# Patient Record
Sex: Male | Born: 1985 | Race: White | Hispanic: No | Marital: Single | Smoking: Former smoker
Health system: Southern US, Community
[De-identification: ages and names within clinical notes are randomized; demographics above are authoritative.]

## PROBLEM LIST (undated history)

## (undated) DIAGNOSIS — F1110 Opioid abuse, uncomplicated: Secondary | ICD-10-CM

---
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
## 2005-01-12 ENCOUNTER — Emergency Department (HOSPITAL_COMMUNITY): Admission: EM | Admit: 2005-01-12 | Discharge: 2005-01-12 | Payer: Self-pay | Admitting: Emergency Medicine

## 2005-01-17 ENCOUNTER — Inpatient Hospital Stay (HOSPITAL_COMMUNITY): Admission: EM | Admit: 2005-01-17 | Discharge: 2005-01-19 | Payer: Self-pay | Admitting: Emergency Medicine

## 2005-01-22 ENCOUNTER — Emergency Department (HOSPITAL_COMMUNITY): Admission: EM | Admit: 2005-01-22 | Discharge: 2005-01-22 | Payer: Self-pay | Admitting: Emergency Medicine

## 2005-04-17 ENCOUNTER — Emergency Department (HOSPITAL_COMMUNITY): Admission: EM | Admit: 2005-04-17 | Discharge: 2005-04-17 | Payer: Self-pay | Admitting: Emergency Medicine

## 2006-01-18 ENCOUNTER — Inpatient Hospital Stay (HOSPITAL_COMMUNITY): Admission: EM | Admit: 2006-01-18 | Discharge: 2006-01-20 | Payer: Self-pay | Admitting: Emergency Medicine

## 2006-07-25 ENCOUNTER — Emergency Department (HOSPITAL_COMMUNITY): Admission: EM | Admit: 2006-07-25 | Discharge: 2006-07-25 | Payer: Self-pay | Admitting: Emergency Medicine

## 2010-09-22 ENCOUNTER — Emergency Department (HOSPITAL_COMMUNITY): Payer: No Typology Code available for payment source

## 2010-09-22 ENCOUNTER — Emergency Department (HOSPITAL_COMMUNITY)
Admission: EM | Admit: 2010-09-22 | Discharge: 2010-09-23 | Disposition: A | Discharge status: Home / Self Care | Payer: No Typology Code available for payment source | Attending: Emergency Medicine | Admitting: Emergency Medicine

## 2010-09-22 DIAGNOSIS — M545 Low back pain, unspecified: Secondary | ICD-10-CM | POA: Insufficient documentation

## 2010-09-22 DIAGNOSIS — S0993XA Unspecified injury of face, initial encounter: Secondary | ICD-10-CM | POA: Insufficient documentation

## 2010-09-22 DIAGNOSIS — M542 Cervicalgia: Secondary | ICD-10-CM | POA: Insufficient documentation

## 2010-12-07 ENCOUNTER — Emergency Department (HOSPITAL_COMMUNITY): Payer: Self-pay

## 2010-12-07 ENCOUNTER — Emergency Department (HOSPITAL_COMMUNITY)
Admission: EM | Admit: 2010-12-07 | Discharge: 2010-12-07 | Disposition: A | Discharge status: Home / Self Care | Payer: Self-pay | Attending: Emergency Medicine | Admitting: Emergency Medicine

## 2010-12-07 DIAGNOSIS — M25473 Effusion, unspecified ankle: Secondary | ICD-10-CM | POA: Insufficient documentation

## 2010-12-07 DIAGNOSIS — M25579 Pain in unspecified ankle and joints of unspecified foot: Secondary | ICD-10-CM | POA: Insufficient documentation

## 2010-12-07 DIAGNOSIS — M25476 Effusion, unspecified foot: Secondary | ICD-10-CM | POA: Insufficient documentation

## 2010-12-07 DIAGNOSIS — F17200 Nicotine dependence, unspecified, uncomplicated: Secondary | ICD-10-CM | POA: Insufficient documentation

## 2010-12-07 DIAGNOSIS — Z9889 Other specified postprocedural states: Secondary | ICD-10-CM | POA: Insufficient documentation

## 2010-12-07 DIAGNOSIS — M79609 Pain in unspecified limb: Secondary | ICD-10-CM | POA: Insufficient documentation

## 2013-08-14 ENCOUNTER — Emergency Department (HOSPITAL_COMMUNITY)
Admission: EM | Admit: 2013-08-14 | Discharge: 2013-08-14 | Disposition: A | Discharge status: Home / Self Care | Payer: Managed Care, Other (non HMO) | Attending: Emergency Medicine | Admitting: Emergency Medicine

## 2013-08-14 ENCOUNTER — Encounter (HOSPITAL_COMMUNITY): Payer: Self-pay | Admitting: Emergency Medicine

## 2013-08-14 DIAGNOSIS — F17200 Nicotine dependence, unspecified, uncomplicated: Secondary | ICD-10-CM | POA: Insufficient documentation

## 2013-08-14 DIAGNOSIS — J020 Streptococcal pharyngitis: Secondary | ICD-10-CM | POA: Insufficient documentation

## 2013-08-14 DIAGNOSIS — Z79899 Other long term (current) drug therapy: Secondary | ICD-10-CM | POA: Insufficient documentation

## 2013-08-14 MED ORDER — PENICILLIN G BENZATHINE 1200000 UNIT/2ML IM SUSP
1.2000 10*6.[IU] | Freq: Once | INTRAMUSCULAR | Status: AC
  Administered 2013-08-14: 1.2 10*6.[IU] via INTRAMUSCULAR
  Filled 2013-08-14: qty 2

## 2013-08-14 MED ORDER — METHYLPREDNISOLONE SODIUM SUCC 125 MG IJ SOLR
125.0000 mg | Freq: Once | INTRAMUSCULAR | Status: AC
  Administered 2013-08-14: 125 mg via INTRAMUSCULAR
  Filled 2013-08-14: qty 2

## 2013-08-14 NOTE — ED Notes (Signed)
Pt c/o sore throat; states tonsil so swollen he is having trouble swallowing food; c/o swelling of nodes right side of neck; muffled voice

## 2013-08-14 NOTE — ED Provider Notes (Signed)
CSN: [id number]     Arrival date & time 08/14/13  1209 History   First MD Initiated Contact with Patient 08/14/13 1225     Chief Complaint  Patient presents with  . Sore Throat   (Consider location/radiation/quality/duration/timing/severity/associated sxs/prior Treatment) Patient is a 28 y.o. male presenting with pharyngitis. The history is provided by the patient and medical records.  Sore Throat Associated symptoms include a sore throat.   This is a 28 year old male with no significant past medical history presented to the ED for sore throat and painful swallowing.  Patient states symptoms started on Saturday and have been progressively worsening. Patient has a history of recurrent strep throat, and was told that if he got this again he would need a tonsillectomy. Patient endorses subjective cervical lymphadenopathy and fever. He denies difficulty swallowing or handling secretions.  Patient requesting water to drink on arrival, VS stable.  History reviewed. No pertinent past medical history. History reviewed. No pertinent past surgical history. No family history on file. History  Substance Use Topics  . Smoking status: Current Every Day Smoker -- 0.50 packs/day    Types: Cigarettes  . Smokeless tobacco: Not on file  . Alcohol Use: Not on file    Review of Systems  HENT: Positive for sore throat.   All other systems reviewed and are negative.   Allergies  Review of patient's allergies indicates no known allergies.  Home Medications   Current Outpatient Rx  Name  Route  Sig  Dispense  Refill  . ibuprofen (ADVIL,MOTRIN) 200 MG tablet   Oral   Take 800 mg by mouth every 6 (six) hours as needed.         . naphazoline-glycerin (CLEAR EYES) 0.012-0.2 % SOLN   Both Eyes   Place 1-2 drops into both eyes every 4 (four) hours as needed for irritation.         Marland Kitchen OVER THE COUNTER MEDICATION   Oral   Take 1 Package by mouth daily. Emergen-C         . Pheniramine-PE-APAP  (THERAFLU FLU & SORE THROAT) 20-10-650 MG PACK   Oral   Take 1 Package by mouth every 6 (six) hours as needed (cough).         . Pseudoeph-Doxylamine-DM-APAP (DAYQUIL/NYQUIL COLD/FLU RELIEF PO)   Oral   Take 2 capsules by mouth 2 (two) times daily as needed. Cough and congestion          BP 133/91  Pulse 90  Temp(Src) 98.3 F (36.8 C)  Resp 20  SpO2 98%  Physical Exam  Nursing note and vitals reviewed. Constitutional: He is oriented to person, place, and time. He appears well-developed and well-nourished.  HENT:  Head: Normocephalic and atraumatic.  Mouth/Throat: Uvula is midline. Posterior oropharyngeal erythema present. No oropharyngeal exudate, posterior oropharyngeal edema or tonsillar abscesses.  Tonsils 2+ bilaterally with exudate on right tonsil; uvula remains midline, no peritonsillar abscess; handling secretions appropriately; no difficulty swallowing; airway patient-- talking non-stop in full complete sentences without difficulty  Eyes: Conjunctivae and EOM are normal. Pupils are equal, round, and reactive to light.  Neck: Trachea normal, normal range of motion and phonation normal. No tracheal tenderness present.  Voice normal  Cardiovascular: Normal rate, regular rhythm and normal heart sounds.   Pulmonary/Chest: Effort normal and breath sounds normal. No stridor.  Abdominal: Soft. Bowel sounds are normal.  Musculoskeletal: Normal range of motion.  Lymphadenopathy:    He has cervical adenopathy.  Neurological: He is alert and oriented to  person, place, and time.  Skin: Skin is warm and dry.  Psychiatric: He has a normal mood and affect.    ED Course  Procedures (including critical care time) Labs Review Labs Reviewed - No data to display Imaging Review No results found.  EKG Interpretation   None       MDM   Final diagnoses:  Strep pharyngitis   Pt with 2+ swollen tonsils that are symmetric w/exudate present on right tonsil, uvula tracking  midline without signs of peritonsillar abscess, handling secretions appropriately, continuously speaking in full complete sentences.  Normal phonation, no stridor or muffled tones.  Pt drinking water in the ED without difficulty. He will be treated for strep pharyngitis with bicillin and solu-medrol and referred to ENT to discuss possible tonsillectomy due to his hx.  Discussed plan with pt, he acknowledged understanding and agreed with plan of care.  Garlon HatchetLisa M Sanders, PA-C 08/14/13 1337

## 2013-08-14 NOTE — ED Provider Notes (Signed)
Medical screening examination/treatment/procedure(s) were performed by non-physician practitioner and as supervising physician I was immediately available for consultation/collaboration.  EKG Interpretation   None         Joel Murphy M Jamie-Lee Galdamez, MD 08/14/13 1556 

## 2013-08-14 NOTE — Discharge Instructions (Signed)
You have been treated for strep throat.  May use over the counter chloraseptic spray or salt water gargles to help with pain. Follow up with ENT if problems occur or if you wish to discuss possible tonsillectomy. Return to the ED for new concerns.

## 2013-08-15 ENCOUNTER — Encounter (HOSPITAL_COMMUNITY): Payer: Self-pay | Admitting: Emergency Medicine

## 2013-08-15 ENCOUNTER — Emergency Department (HOSPITAL_COMMUNITY)
Admission: EM | Admit: 2013-08-15 | Discharge: 2013-08-15 | Disposition: A | Discharge status: Home / Self Care | Payer: Managed Care, Other (non HMO) | Attending: Emergency Medicine | Admitting: Emergency Medicine

## 2013-08-15 DIAGNOSIS — H9209 Otalgia, unspecified ear: Secondary | ICD-10-CM | POA: Insufficient documentation

## 2013-08-15 DIAGNOSIS — F17200 Nicotine dependence, unspecified, uncomplicated: Secondary | ICD-10-CM | POA: Insufficient documentation

## 2013-08-15 DIAGNOSIS — J029 Acute pharyngitis, unspecified: Secondary | ICD-10-CM | POA: Insufficient documentation

## 2013-08-15 DIAGNOSIS — H9201 Otalgia, right ear: Secondary | ICD-10-CM

## 2013-08-15 MED ORDER — IBUPROFEN 800 MG PO TABS
800.0000 mg | ORAL_TABLET | Freq: Once | ORAL | Status: AC
  Administered 2013-08-15: 800 mg via ORAL
  Filled 2013-08-15: qty 1

## 2013-08-15 MED ORDER — ANTIPYRINE-BENZOCAINE 5.4-1.4 % OT SOLN
3.0000 [drp] | Freq: Once | OTIC | Status: AC
  Administered 2013-08-15: 4 [drp] via OTIC
  Filled 2013-08-15: qty 10

## 2013-08-15 NOTE — ED Provider Notes (Signed)
CSN: [id number]     Arrival date & time 08/15/13  1329 History  This chart was scribed for non-physician practitioner, Coral CeoJessica Emmitt Matthews, PA-C working with Layla MawKristen N Ward, DO by Greggory StallionKayla Andersen, ED scribe. This patient was seen in room WTR8/WTR8 and the patient's care was started at 1:57 PM.   Chief Complaint  Patient presents with  . Otalgia  . Sore Throat   The history is provided by the patient. No language interpreter was used.   HPI Comments: Joel Murphy is a 28 y.o. male who presents to the Emergency Department complaining of continuing right ear pain. He was seen here yesterday for bilateral ear pain and sore throat that started 6 days ago. Pt was given bicillin and solumedrol in the ED and states it provided some relief of his sore throat, but he now has continued right ear pain. He has also been having rhinorrhea, nasal congestion, and fatigue. Pt has taken ibuprofen, DayQuil, NyQuil and theraflu with no relief. Denies fever, cough, abdominal pain, diarrhea, emesis, headache. States he has very large tonsils and was told he needed to have them removed. Given referral to ENT yesterday but did not make appointment.    History reviewed. No pertinent past medical history. History reviewed. No pertinent past surgical history. No family history on file. History  Substance Use Topics  . Smoking status: Current Every Day Smoker -- 0.25 packs/day for 10 years    Types: Cigarettes  . Smokeless tobacco: Former NeurosurgeonUser    Types: Chew  . Alcohol Use: Yes     Comment: Socially     Review of Systems  Constitutional: Negative for fever.  HENT: Positive for ear pain, rhinorrhea and sore throat.   Respiratory: Negative for cough.   Gastrointestinal: Negative for vomiting, abdominal pain and diarrhea.  Neurological: Negative for headaches.  All other systems reviewed and are negative.   Allergies  Review of patient's allergies indicates no known allergies.  Home Medications   Current  Outpatient Rx  Name  Route  Sig  Dispense  Refill  . ibuprofen (ADVIL,MOTRIN) 200 MG tablet   Oral   Take 800 mg by mouth every 6 (six) hours as needed.         . naphazoline-glycerin (CLEAR EYES) 0.012-0.2 % SOLN   Both Eyes   Place 1-2 drops into both eyes every 4 (four) hours as needed for irritation.         Marland Kitchen. OVER THE COUNTER MEDICATION   Oral   Take 1 Package by mouth daily. Emergen-C         . Pheniramine-PE-APAP (THERAFLU FLU & SORE THROAT) 20-10-650 MG PACK   Oral   Take 1 Package by mouth every 6 (six) hours as needed (cough).         . Pseudoeph-Doxylamine-DM-APAP (DAYQUIL/NYQUIL COLD/FLU RELIEF PO)   Oral   Take 2 capsules by mouth 2 (two) times daily as needed. Cough and congestion          BP 142/95  Pulse 98  Temp(Src) 97.7 F (36.5 C) (Oral)  Resp 18  SpO2 99%  Filed Vitals:   08/15/13 1335  BP: 142/95  Pulse: 98  Temp: 97.7 F (36.5 C)  TempSrc: Oral  Resp: 18  SpO2: 99%    Physical Exam  Nursing note and vitals reviewed. Constitutional: He is oriented to person, place, and time. He appears well-developed and well-nourished. No distress.  HENT:  Head: Normocephalic and atraumatic.  Right Ear: External ear normal.  Left  Ear: External ear normal.  Nose: Nose normal.  Mouth/Throat: Oropharynx is clear and moist. No oropharyngeal exudate.  Tympanic membranes gray and translucent bilaterally with no erythema, edema, or hemotympanum. No tragal or mastoid tenderness bilaterally. Tonsils 2+ equal bilaterally with erythema. No exudates. Uvula midline. No trismus. No difficulty controlling secretions.   Eyes: Conjunctivae and EOM are normal. Pupils are equal, round, and reactive to light.  Neck: Neck supple. No tracheal deviation present.  No LAD. No rigidity  Cardiovascular: Normal rate, regular rhythm and normal heart sounds.  Exam reveals no gallop and no friction rub.   No murmur heard. Pulmonary/Chest: Effort normal and breath sounds  normal. No respiratory distress. He has no wheezes. He has no rales. He exhibits no tenderness.  Abdominal: Soft. There is no tenderness.  Musculoskeletal: Normal range of motion. He exhibits no edema and no tenderness.  Neurological: He is alert and oriented to person, place, and time.  Skin: Skin is warm and dry. He is not diaphoretic.  Psychiatric: He has a normal mood and affect. His behavior is normal.    ED Course  Procedures (including critical care time)  DIAGNOSTIC STUDIES: Oxygen Saturation is 99% on RA, normal by my interpretation.    COORDINATION OF CARE: 2:03 PM-Discussed treatment plan which includes ear drops with pt at bedside and pt agreed to plan. Pt states he does not want a mono test done. Refused blood test.   Labs Review Labs Reviewed - No data to display Imaging Review No results found.  EKG Interpretation  None  MDM   Joel Murphy is a 28 y.o. male is a 28 y.o. male who presents to the Emergency Department complaining of continuing right ear pain   Rechecks  2:30 PM = Pain improved after Auralgan and Ibuprofen. Requesting discharge. Has to leave ED.     Patient seen in the ED yesterday for sore throat and ear pain. He was treated with bicillin and solumedrol in the ED with improvement in his sore throat. He continues to have right ear pain. No evidence of OM or mastoiditis. Patient afebrile and non-toxic in appearance. No evidence of retropharyngeal or peritonsillar abscess. Patient able to tolerate oral fluids without difficulty. No respiratory distress or difficulty controlling secretions. Patient instructed to follow-up with ENT. Some concerns about mono as a possible cause of his symptoms with increased fatigue. Patient refused blood testing. Educated patient about concerns for mono. Return precautions, discharge instructions, and follow-up was discussed with the patient before discharge.      Discharge Medication List as of 08/15/2013  2:47 PM       Final impressions: 1. Pharyngitis   2. Right ear pain      Joel Murphy   I personally performed the services described in this documentation, which was scribed in my presence. The recorded information has been reviewed and is accurate.       Jillyn Ledger, PA-C 08/16/13 1231

## 2013-08-15 NOTE — Discharge Instructions (Signed)
Take Ibuprofen 600-800 mg every 6-8 hours for pain  Drink plenty of fluids and rest  Follow-up with ENT tomorrow  Return to the emergency department if you develop any changing/worsening condition, fever, difficulty swallowing or breathing, stiff neck, or any other concerns (please read additional information regarding your condition below)     Pharyngitis Pharyngitis is redness, pain, and swelling (inflammation) of your pharynx.  CAUSES  Pharyngitis is usually caused by infection. Most of the time, these infections are from viruses (viral) and are part of a cold. However, sometimes pharyngitis is caused by bacteria (bacterial). Pharyngitis can also be caused by allergies. Viral pharyngitis may be spread from person to person by coughing, sneezing, and personal items or utensils (cups, forks, spoons, toothbrushes). Bacterial pharyngitis may be spread from person to person by more intimate contact, such as kissing.  SIGNS AND SYMPTOMS  Symptoms of pharyngitis include:   Sore throat.   Tiredness (fatigue).   Low-grade fever.   Headache.  Joint pain and muscle aches.  Skin rashes.  Swollen lymph nodes.  Plaque-like film on throat or tonsils (often seen with bacterial pharyngitis). DIAGNOSIS  Your health care provider will ask you questions about your illness and your symptoms. Your medical history, along with a physical exam, is often all that is needed to diagnose pharyngitis. Sometimes, a rapid strep test is done. Other lab tests may also be done, depending on the suspected cause.  TREATMENT  Viral pharyngitis will usually get better in 3 4 days without the use of medicine. Bacterial pharyngitis is treated with medicines that kill germs (antibiotics).  HOME CARE INSTRUCTIONS   Drink enough water and fluids to keep your urine clear or pale yellow.   Only take over-the-counter or prescription medicines as directed by your health care provider:   If you are prescribed  antibiotics, make sure you finish them even if you start to feel better.   Do not take aspirin.   Get lots of rest.   Gargle with 8 oz of salt water ( tsp of salt per 1 qt of water) as often as every 1 2 hours to soothe your throat.   Throat lozenges (if you are not at risk for choking) or sprays may be used to soothe your throat. SEEK MEDICAL CARE IF:   You have large, tender lumps in your neck.  You have a rash.  You cough up green, yellow-brown, or bloody spit. SEEK IMMEDIATE MEDICAL CARE IF:   Your neck becomes stiff.  You drool or are unable to swallow liquids.  You vomit or are unable to keep medicines or liquids down.  You have severe pain that does not go away with the use of recommended medicines.  You have trouble breathing (not caused by a stuffy nose). MAKE SURE YOU:   Understand these instructions.  Will watch your condition.  Will get help right away if you are not doing well or get worse. Document Released: 06/05/2005 Document Revised: 03/26/2013 Document Reviewed: 02/10/2013 [facility] Patient Information 2014 [place], [place].  Otalgia The most common reason for this in children is an infection of the middle ear. Pain from the middle ear is usually caused by a build-up of fluid and pressure behind the eardrum. Pain from an earache can be sharp, dull, or burning. The pain may be temporary or constant. The middle ear is connected to the nasal passages by a short narrow tube called the Eustachian tube. The Eustachian tube allows fluid to drain out of the middle  ear, and helps keep the pressure in your ear equalized. CAUSES  A cold or allergy can block the Eustachian tube with inflammation and the build-up of secretions. This is especially likely in small children, because their Eustachian tube is shorter and more horizontal. When the Eustachian tube closes, the normal flow of fluid from the middle ear is stopped. Fluid can accumulate and cause stuffiness, pain,  hearing loss, and an ear infection if germs start growing in this area. SYMPTOMS  The symptoms of an ear infection may include fever, ear pain, fussiness, increased crying, and irritability. Many children will have temporary and minor hearing loss during and right after an ear infection. Permanent hearing loss is rare, but the risk increases the more infections a child has. Other causes of ear pain include retained water in the outer ear canal from swimming and bathing. Ear pain in adults is less likely to be from an ear infection. Ear pain may be referred from other locations. Referred pain may be from the joint between your jaw and the skull. It may also come from a tooth problem or problems in the neck. Other causes of ear pain include:  A foreign body in the ear.  Outer ear infection.  Sinus infections.  Impacted ear wax.  Ear injury.  Arthritis of the jaw or TMJ problems.  Middle ear infection.  Tooth infections.  Sore throat with pain to the ears. DIAGNOSIS  Your caregiver can usually make the diagnosis by examining you. Sometimes other special studies, including x-rays and lab work may be necessary. TREATMENT   If antibiotics were prescribed, use them as directed and finish them even if you or your child's symptoms seem to be improved.  Sometimes PE tubes are needed in children. These are little plastic tubes which are put into the eardrum during a simple surgical procedure. They allow fluid to drain easier and allow the pressure in the middle ear to equalize. This helps relieve the ear pain caused by pressure changes. HOME CARE INSTRUCTIONS   Only take over-the-counter or prescription medicines for pain, discomfort, or fever as directed by your caregiver. DO NOT GIVE CHILDREN ASPIRIN because of the association of Reye's Syndrome in children taking aspirin.  Use a cold pack applied to the outer ear for 15-20 minutes, 03-04 times per day or as needed may reduce pain. Do not  apply ice directly to the skin. You may cause frost bite.  Over-the-counter ear drops used as directed may be effective. Your caregiver may sometimes prescribe ear drops.  Resting in an upright position may help reduce pressure in the middle ear and relieve pain.  Ear pain caused by rapidly descending from high altitudes can be relieved by swallowing or chewing gum. Allowing infants to suck on a bottle during airplane travel can help.  Do not smoke in the house or near children. If you are unable to quit smoking, smoke outside.  Control allergies. SEEK IMMEDIATE MEDICAL CARE IF:   You or your child are becoming sicker.  Pain or fever relief is not obtained with medicine.  You or your child's symptoms (pain, fever, or irritability) do not improve within 24 to 48 hours or as instructed.  Severe pain suddenly stops hurting. This may indicate a ruptured eardrum.  You or your children develop new problems such as severe headaches, stiff neck, difficulty swallowing, or swelling of the face or around the ear. Document Released: 01/21/2004 Document Revised: 08/28/2011 Document Reviewed: 05/27/2008 [facility] Patient Information 2014 [place], [place].  Emergency Department Resource Guide 1) Find a Doctor and Pay Out of Pocket Although you won't have to find out who is covered by your insurance plan, it is a good idea to ask around and get recommendations. You will then need to call the office and see if the doctor you have chosen will accept you as a new patient and what types of options they offer for patients who are self-pay. Some doctors offer discounts or will set up payment plans for their patients who do not have insurance, but you will need to ask so you aren't surprised when you get to your appointment.  2) Contact Your Local Health Department Not all health departments have doctors that can see patients for sick visits, but many do, so it is worth a call to see if yours does. If you  don't know where your local health department is, you can check in your phone book. The CDC also has a tool to help you locate your state's health department, and many state websites also have listings of all of their local health departments.  3) Find a Walk-in Clinic If your illness is not likely to be very severe or complicated, you may want to try a walk in clinic. These are popping up all over the country in pharmacies, drugstores, and shopping centers. They're usually staffed by nurse practitioners or physician assistants that have been trained to treat common illnesses and complaints. They're usually fairly quick and inexpensive. However, if you have serious medical issues or chronic medical problems, these are probably not your best option.  No Primary Care Doctor: - Call Health Connect at  [phone] - they can help you locate a primary care doctor that  accepts your insurance, provides certain services, etc. - Physician Referral Service- [phone]  Chronic Pain Problems: Organization         Address  Phone   Notes  [facility]  [phone] Patients need to be referred by their primary care doctor.   Medication Assistance: Organization         Address  Phone   Notes  [facility] Medication [facility] [address] [place] [place], [place] [address] [phone] --Must be a resident of [facility] -- Must have NO insurance coverage whatsoever (no Medicaid/ Medicare, etc.) -- The pt. MUST have a primary care doctor that directs their care regularly and follows them in the community   [facility]  [phone]   [employer]  [phone]    Agencies that provide inexpensive medical care: Organization         Address  Phone   Notes  [facility]  [phone]   [facility]    [phone]   [facility] [address] [place], [place] [address] [phone]   [facility] [address] [place] [place] [phone]   Planned Parenthood    [phone]   [facility]    [phone]   Community Health and [facility]  [address], [place] Phone:  [phone], Fax:  [phone] Hours of Operation:  9 am - 6 pm, M-F.  Also accepts Medicaid/Medicare and self-pay.  [facility]  [address], [place] Phone: [phone], Fax: [phone]. Hours of Operation:  8:30 am - 5:30 pm, M-F.  Also accepts Medicaid and self-pay.  [facility] [place]  [address], [employer] Phone: [phone]   [facility] [address] Natasha Bence [place], [place] [phone], Ext. [phone] Mondays & Thursdays: 7-9 AM.  First 15 patients are seen on a first come, first serve basis.    Medicaid-accepting [facility] Providers:  Organization         Address  Phone   Notes  [facility] [address], [place] [phone] Also accepts self-pay patients.  [facility] [address] Laurell Josephs [place], [place]  [phone]   [facility] [address], [place] [phone]   [facility] [address] [place] [phone]   Renaye Rakers [address], [place]   [phone] Only accepts [place] Access [place] patients after they have their name applied to their card.   Self-Pay (no insurance) in [facility]:  Organization         Address  Phone   Notes  Sickle Cell Patients, [facility] Medicine [address] [place], [place] [phone]   [facility] [address] [place], [place] [phone]   [facility] [place]  [address], [place] [phone]   [facility]/Dr. Osei-Bonsu  [address], [place] or [address], [place] [phone] Phone  number for both [place] and [place] locations is the same.  Urgent Medical and [facility] [address] [place] [phone]   [facility] [address], [place] or [address] [phone]   [facility] [address], [place] [phone], phone; [phone], fax Sees patients 1st and 3rd Saturday of every month.  Must not qualify for public or private insurance (i.e. Medicaid, Medicare, [place] Health Choice, Veterans' Benefits)  Household income should be no more than 200% of the poverty level The clinic cannot treat you if you are pregnant or think you are pregnant  Sexually transmitted diseases are not treated at the clinic.    Dental Care: Organization         Address  Phone  Notes  [facility] Department of [facility] [address] [place], [place] [phone] Accepts children up to age 25 who are enrolled in [place] or [place] Health Choice; pregnant women with a Medicaid card; and children who have applied for Medicaid or [place] Health Choice, but were declined, whose parents can pay a reduced fee at time of service.  [facility] Department of [facility]  [address], [place] [phone] Accepts children up to age 32 who are enrolled in [place] or [place] Health Choice; pregnant women with a Medicaid card; and children who have applied for Medicaid or [place] Health Choice, but were declined, whose parents can pay a reduced fee at time of service.  [facility]  [address] [place], [place] [phone] Patients are seen by appointment only. Walk-ins are not accepted. [facility] will see patients 78 years of age and older. Monday - Tuesday (8am-5pm) Most Wednesdays (8:30-5pm) $30 per visit, cash only  [facility]  [address], [facility] [phone] Patients are seen by appointment only.  Walk-ins are not accepted. [facility] will see patients 77 years of age and older.  One Wednesday Evening (Monthly: Volunteer Based).  $30 per visit, cash only  [employer]  [phone] for adults; Children under age 31, call Graduate Pediatric Dentistry at [phone]. Children aged 16-14, please call [phone] to request a pediatric application.  Dental services are provided in all areas of dental care including fillings, crowns and bridges, complete and partial dentures, implants, gum treatment, root canals, and extractions. Preventive care is also provided. Treatment is provided to both adults and children. Patients are selected via a lottery and there is often a waiting list.   [facility] [address], [place]  [phone] [email or web address]   [facility] [address] [place], [place] [phone], Ext. [phone] Second and Fourth Thursday of each month, opens at 6:30 AM; Clinic ends at 9 AM.  Patients are seen on a first-come first-served basis, and a limited number are seen during each clinic.   [facility]  [address] Ether Griffins [place], [place] [phone]   Eligibility Requirements You must have lived in [place], [place], or [place] counties for at least the last three months.   You cannot be eligible for state or federal sponsored [place], including [employer], [place], or [employer].   You generally cannot be eligible for healthcare insurance through your employer.    How to apply: Eligibility screenings are held every Tuesday and Wednesday afternoon from 1:00 pm until 4:00 pm. You do not need an appointment for the interview!  [facility] [address], [place], [place] [phone]   [facility] Health Department  [phone]   [facility] Health Department  [phone]   [facility]  [phone]    Behavioral Health  Resources in the Community: Intensive Outpatient Programs Organization         Address  Phone  Notes  [facility] [address] [place], [place] [phone]   [facility] Outpatient [address] [place], [place] [phone]   [facility] [address] [place], [place]  [phone]   [facility] [address]  [place], [place] [phone] or [phone]   Substance Abuse Resources Organization         Address  Phone  Notes  [facility] Drug Services  [phone]   [facility]  [phone]   [facility] [place]  [phone]   Floydene Flock  [phone]   Residential & Outpatient Substance Abuse Program  [phone]   Psychological Services Organization         Address  Phone  Notes  [facility] Behavioral Health  [phone]   [facility] Services  [phone]   [facility] [address] [place] [phone] or [phone]    Mobile Crisis Teams Organization         Address  Phone  Notes  [facility], Mobile Crisis Care Unit  [phone]   [facility]  [address] [place], [place] [phone]   Doristine Locks [address] [place] [phone]    Self-Help/Support Groups Organization         Address  Phone             Notes  [facility] [place] - variety of support groups  336- I7437963 Call for more information  Narcotics Anonymous (NA), [facility] [address], [employer] [place]  2 meetings at this location  Residential Treatment Programs Organization         Address  Phone  Notes  [facility] [address],    [place]  [phone]   [facility]  [address] [place] [address], [place], [place] [phone]   [facility] [address] [place], [place] [phone] Admissions: 8am-3pm M-F  [facility] Substance Abuse  Treatment Center [address]    [place], [place] [phone]   [facility] [address] [place], [place], [place] [phone]   [facility] [address],  [place], [place] [phone]   [facility] - Intensive Outpatient [address] Laurell Josephs [address], [place], [place] [phone]   [facility] ([facility]iction [facility] [address]  [place], [place] [phone] or [phone]   [facility] ([facility]) [address], [place], [place] [phone] Accepts Medicaid  [facility] [place] [address]  [place] [phone] Substance Abuse/Addiction Treatment   [facility] Organization         Address  Phone  Notes  [facility]  [phone]   Angie Fava, PhD [address] Ervin Knack [place], [place]   [phone] or [phone]   [facility] Behavioral   [address] [place], [place] [phone]   [facility] [address] [place], [place] [phone] Insurance/Medicaid/sponsorship through [facility] and Families [address]                                    [place], [place] [phone] Therapy/tele-psych/case  [facility] [address] [place] [address] [address], [place] [phone]    Dr. Lolly Mustache  [phone]   [facility] [place]  [employer] [facility] Dept. 1) [address], [place] 2) [address], [place] 3)  [address], [place] [phone]  [phone]   [facility] Child Abuse Hotline [phone] or [phone] (After Hours)

## 2013-08-15 NOTE — ED Notes (Signed)
Pt presents with right ear pain. Pt was seen yesterday with sore throat and bilateral ear pain. Pt states he feels better after the two shots he was given yesterday, however the right ear and right lateral neck pain persists. Pt is A/O x4, in NAD, and vitals are WDL.

## 2013-08-15 NOTE — ED Notes (Signed)
Pt tolerating fluids.   

## 2013-08-15 NOTE — Progress Notes (Signed)
   CARE MANAGEMENT ED NOTE 08/15/2013  [facility]  [facility]   Account Number:  [id number]  Date Initiated:  08/15/2013  Documentation initiated by:  Radford PaxFERRERO,Reilynn Lauro  Subjective/Objective Assessment:   Patient presents to Ed with right ear pain.     Subjective/Objective Assessment Detail:     Action/Plan:   Action/Plan Detail:   Anticipated DC Date:       Status Recommendation to Physician:   Result of Recommendation:    Other ED Services  Consult Working Plan    DC Planning Services  Other  PCP issues    Choice offered to / List presented to:            Status of service:  Completed, signed off  ED Comments:   ED Comments Detail:  Patient reports he has [employer] insurance but did not bring in his insurance card with him. [facility] provided patient with form to fill out with his insurnace information to send back so that ED visit will be covered under [employer] insurance. Patient confirms he does not have a pcp.  EDCM instructed patient to call the phone number on the back of his insurance card or go to insurance company website to help him find a pcp whois close to him and within network. Patient thankful for information.  No further EDCM needs at this time.

## 2013-08-18 NOTE — ED Provider Notes (Signed)
Medical screening examination/treatment/procedure(s) were performed by non-physician practitioner and as supervising physician I was immediately available for consultation/collaboration.   EKG Interpretation None        Kristen N Ward, DO 08/18/13 1259 

## 2013-09-26 ENCOUNTER — Emergency Department (HOSPITAL_COMMUNITY)
Admission: EM | Admit: 2013-09-26 | Discharge: 2013-09-26 | Disposition: A | Discharge status: Home / Self Care | Payer: Managed Care, Other (non HMO) | Attending: Emergency Medicine | Admitting: Emergency Medicine

## 2013-09-26 ENCOUNTER — Encounter (HOSPITAL_COMMUNITY): Payer: Self-pay | Admitting: Emergency Medicine

## 2013-09-26 DIAGNOSIS — L02219 Cutaneous abscess of trunk, unspecified: Secondary | ICD-10-CM | POA: Insufficient documentation

## 2013-09-26 DIAGNOSIS — L03319 Cellulitis of trunk, unspecified: Principal | ICD-10-CM

## 2013-09-26 DIAGNOSIS — F17200 Nicotine dependence, unspecified, uncomplicated: Secondary | ICD-10-CM | POA: Insufficient documentation

## 2013-09-26 DIAGNOSIS — L0390 Cellulitis, unspecified: Secondary | ICD-10-CM

## 2013-09-26 MED ORDER — CLINDAMYCIN HCL 150 MG PO CAPS: 300.0000 mg | ORAL_CAPSULE | Freq: Three times a day (TID) | ORAL | Status: DC

## 2013-09-26 MED ORDER — HYDROCODONE-ACETAMINOPHEN 5-325 MG PO TABS: 2.0000 | ORAL_TABLET | ORAL | Status: DC | PRN

## 2013-09-26 MED ORDER — TRAMADOL HCL 50 MG PO TABS
50.0000 mg | ORAL_TABLET | Freq: Once | ORAL | Status: AC
  Administered 2013-09-26: 50 mg via ORAL
  Filled 2013-09-26: qty 1

## 2013-09-26 MED ORDER — CLINDAMYCIN HCL 300 MG PO CAPS
300.0000 mg | ORAL_CAPSULE | Freq: Once | ORAL | Status: AC
  Administered 2013-09-26: 300 mg via ORAL
  Filled 2013-09-26: qty 1

## 2013-09-26 NOTE — Discharge Instructions (Signed)
Take Clindamycin as directed until gone. Take Vicodin as needed for pain. Refer to attached documents for more information. Return to the ED with worsening or concerning symptoms.  °

## 2013-09-26 NOTE — ED Provider Notes (Signed)
CSN: [id number]     Arrival date & time 09/26/13  1418 History  This chart was scribed for non-physician practitioner, Emilia BeckKaitlyn Armel Rabbani, PA-C working with Merrie RoofJohn David Wofford III, MD by Greggory StallionKayla Andersen, ED scribe. This patient was seen in room WTR7/WTR7 and the patient's care was started at 3:27 PM.   Chief Complaint  Patient presents with  . Abscess   The history is provided by the patient. No language interpreter was used.   HPI Comments: Joel Murphy is a 28 y.o. male who presents to the Emergency Department complaining of an abscess to his lower abdominal that started 3 days ago. The area is painful. He describes severe throbbing pain. He is unsure if it might be from an ingrown hair. Pt has abscesses on his leg in the past. Denies any drainage. Palpation worsens the pain. No associated symptoms.   History reviewed. No pertinent past medical history. History reviewed. No pertinent past surgical history. No family history on file. History  Substance Use Topics  . Smoking status: Current Every Day Smoker -- 0.25 packs/day for 10 years    Types: Cigarettes  . Smokeless tobacco: Former NeurosurgeonUser    Types: Chew  . Alcohol Use: Yes     Comment: Socially     Review of Systems  Skin:       Abscess.  All other systems reviewed and are negative.  Allergies  Review of patient's allergies indicates no known allergies.  Home Medications  No current outpatient prescriptions on file.  BP 129/66  Pulse 89  Temp(Src) 98.3 F (36.8 C) (Oral)  Resp 20  SpO2 100%  Physical Exam  Nursing note and vitals reviewed. Constitutional: He is oriented to person, place, and time. He appears well-developed and well-nourished. No distress.  HENT:  Head: Normocephalic and atraumatic.  Eyes: EOM are normal.  Neck: Neck supple. No tracheal deviation present.  Cardiovascular: Normal rate.   Pulmonary/Chest: Effort normal. No respiratory distress.  Musculoskeletal: Normal range of motion.  Neurological:  He is alert and oriented to person, place, and time.  Skin: Skin is warm and dry.  3 x 2 cm area of erythema and tenderness. No fluctuance noted. No drainage noted.   Psychiatric: He has a normal mood and affect. His behavior is normal.    ED Course  Procedures (including critical care time)  DIAGNOSTIC STUDIES: Oxygen Saturation is 100% on RA, normal by my interpretation.    COORDINATION OF CARE: 3:28 PM-Discussed treatment plan which includes pain medication, an antibiotic and warm compresses with pt at bedside and pt agreed to plan.   Labs Review Labs Reviewed - No data to display Imaging Review No results found.   EKG Interpretation None      MDM   Final diagnoses:  Cellulitis    3:32 PM Patient has cellulitis with no drainable abscess. Patient will have Clindamycin and Tramadol here. Patient will be discharged with Clindamycin and Vicodin for symptoms. Vitals stable and patient afebrile. Patient advised to return to the ED with worsening or concerning symptoms.   I personally performed the services described in this documentation, which was scribed in my presence. The recorded information has been reviewed and is accurate.  Emilia BeckKaitlyn Aleina Burgio, PA-C 09/26/13 1534

## 2013-09-26 NOTE — ED Notes (Signed)
Pt has abscess on lower abd. Not for sure if from in grown hair or not. Pt states that he has had these in the past. Pt states that it hasnt been draining but red and painful.

## 2013-09-27 NOTE — ED Provider Notes (Signed)
Medical screening examination/treatment/procedure(s) were performed by non-physician practitioner and as supervising physician I was immediately available for consultation/collaboration.   Candyce ChurnJohn David Kerman Pfost III, MD 09/27/13 1003

## 2014-08-22 ENCOUNTER — Encounter (HOSPITAL_COMMUNITY): Payer: Self-pay | Admitting: Physical Medicine and Rehabilitation

## 2014-08-22 ENCOUNTER — Emergency Department (HOSPITAL_COMMUNITY)
Admission: EM | Admit: 2014-08-22 | Discharge: 2014-08-22 | Discharge status: Against Medical Advice | Payer: Managed Care, Other (non HMO) | Attending: Emergency Medicine | Admitting: Emergency Medicine

## 2014-08-22 DIAGNOSIS — M25532 Pain in left wrist: Secondary | ICD-10-CM

## 2014-08-22 DIAGNOSIS — Z720 Tobacco use: Secondary | ICD-10-CM | POA: Insufficient documentation

## 2014-08-22 MED ORDER — IBUPROFEN 400 MG PO TABS
600.0000 mg | ORAL_TABLET | Freq: Once | ORAL | Status: AC
  Administered 2014-08-22: 600 mg via ORAL
  Filled 2014-08-22 (×2): qty 1

## 2014-08-22 NOTE — ED Provider Notes (Signed)
CSN: [id number]     Arrival date & time 08/22/14  1549 History  This chart is scribed for non-physician practitioner, Junius FinnerErin O'Malley, PA-C, working with Gilda Creasehristopher J. Pollina, MD by Abel PrestoKara Demonbreun, ED Scribe.  This patient was seen in room TR08C/TR08C and the patient's care was started 4:33 PM.     Chief Complaint  Patient presents with  . Hand Pain     The history is provided by the patient. No language interpreter was used.   HPI Comments: Joel Murphy is a 29 y.o. male who presents to the Emergency Department complaining of left wrist pain with onset today. He states he has been working 15 hour days and has not been getting much sleep, and fell asleep in his car last night, resting on left side. . Pt has not taken medication for relief. Pt denies EtOH use last night, known injury, numbness and tingling, shoulder pain, elbow pain, and h/o of injury to the area.   History reviewed. No pertinent past medical history. History reviewed. No pertinent past surgical history. History reviewed. No pertinent family history. History  Substance Use Topics  . Smoking status: Current Every Day Smoker -- 0.25 packs/day for 10 years    Types: Cigarettes  . Smokeless tobacco: Former NeurosurgeonUser    Types: Chew  . Alcohol Use: No    Review of Systems  Musculoskeletal: Positive for myalgias. Negative for joint swelling and arthralgias.  Skin: Negative for wound.  Neurological: Negative for numbness.  All other systems reviewed and are negative.     Allergies  Review of patient's allergies indicates no known allergies.  Home Medications   Prior to Admission medications   Medication Sig Start Date End Date Taking? Authorizing Provider  clindamycin (CLEOCIN) 150 MG capsule Take 2 capsules (300 mg total) by mouth 3 (three) times daily. May dispense as 150mg  capsules 09/26/13   Emilia BeckKaitlyn Szekalski, PA-C  HYDROcodone-acetaminophen (NORCO/VICODIN) 5-325 MG per tablet Take 2 tablets by mouth every 4 (four)  hours as needed. 09/26/13   Kaitlyn Szekalski, PA-C   BP 144/84 mmHg  Pulse 96  Temp(Src) 98.7 F (37.1 C) (Oral)  Resp 18  SpO2 99% Physical Exam  Constitutional: He is oriented to person, place, and time. He appears well-developed and well-nourished.  HENT:  Head: Normocephalic and atraumatic.  Eyes: EOM are normal.  Neck: Normal range of motion.  Cardiovascular: Normal rate.   Pulses:      Radial pulses are 2+ on the left side.  Pulmonary/Chest: Effort normal.  Musculoskeletal: Normal range of motion.  Tenderness to radial aspect of left wrist; decreased flexion and extension due to pain; stiffness with passive ROM; sensation intact  Neurological: He is alert and oriented to person, place, and time.  Skin: Skin is warm and dry.  Psychiatric: He has a normal mood and affect. His behavior is normal.  Nursing note and vitals reviewed.   ED Course  Procedures (including critical care time) DIAGNOSTIC STUDIES: Oxygen Saturation is 99% on room air, normal by my interpretation.    COORDINATION OF CARE: 4:36 PM Discussed treatment plan with patient at beside, the patient agrees with the plan and has no further questions at this time.  4:59 PM Pt out to car, 2nd time.      Labs Review Labs Reviewed - No data to display  Imaging Review No results found.   EKG Interpretation None      MDM   Final diagnoses:  None   5:18 PM Celine AhrAudrey McKeown, RN  checked for pt in waiting room as pt still has not come back from getting "something" out of his car. Pt was not around for x-ray when transport came.  It appears pt has eloped.  Left prior to completion of his exam.    Filed Vitals:   08/22/14 1610  BP: 144/84  Pulse: 96  Temp: 98.7 F (37.1 C)  Resp: 18   Pt did not appear to be in any acute distress. Low concern for emergent process taking place.   I personally performed the services described in this documentation, which was scribed in my presence. The recorded  information has been reviewed and is accurate.     Junius Finner, PA-C 08/22/14 1721  Gilda Crease, MD 08/22/14 [phone]

## 2014-08-22 NOTE — ED Notes (Signed)
Pt presents to department for evaluation of L hand pain, states she slept on L hand last night in car, now states increased pain. 5/10 pain upon arrival to ED. No obvious deformities noted.

## 2014-08-22 NOTE — ED Notes (Signed)
X-ray Transporter  arrived to Pt room to transport Pt for x-ray. Pt is still absent from room.

## 2014-08-22 NOTE — ED Notes (Signed)
Unable to ,located PT in front lobby.

## 2014-08-22 NOTE — ED Notes (Signed)
PT left room   To go to car.

## 2016-08-25 ENCOUNTER — Emergency Department (HOSPITAL_COMMUNITY)
Admission: EM | Admit: 2016-08-25 | Discharge: 2016-08-26 | Disposition: A | Discharge status: Home / Self Care | Payer: Managed Care, Other (non HMO) | Attending: Emergency Medicine | Admitting: Emergency Medicine

## 2016-08-25 ENCOUNTER — Encounter (HOSPITAL_COMMUNITY): Payer: Self-pay

## 2016-08-25 DIAGNOSIS — J36 Peritonsillar abscess: Secondary | ICD-10-CM | POA: Insufficient documentation

## 2016-08-25 DIAGNOSIS — J029 Acute pharyngitis, unspecified: Secondary | ICD-10-CM

## 2016-08-25 DIAGNOSIS — J020 Streptococcal pharyngitis: Secondary | ICD-10-CM

## 2016-08-25 DIAGNOSIS — F17210 Nicotine dependence, cigarettes, uncomplicated: Secondary | ICD-10-CM | POA: Insufficient documentation

## 2016-08-25 DIAGNOSIS — R791 Abnormal coagulation profile: Secondary | ICD-10-CM | POA: Insufficient documentation

## 2016-08-25 NOTE — ED Triage Notes (Signed)
He c/o sore throat and states that he has a hx of strep throat. Pt tonsils appear swollen and voice is hoarse. He denies SOB. A&Ox4.

## 2016-08-26 ENCOUNTER — Emergency Department (HOSPITAL_COMMUNITY): Payer: Managed Care, Other (non HMO)

## 2016-08-26 ENCOUNTER — Encounter (HOSPITAL_COMMUNITY): Payer: Self-pay | Admitting: Radiology

## 2016-08-26 LAB — RAPID STREP SCREEN (MED CTR MEBANE ONLY): Streptococcus, Group A Screen (Direct): POSITIVE — AB

## 2016-08-26 LAB — BASIC METABOLIC PANEL
Anion gap: 8 (ref 5–15)
BUN: 14 mg/dL (ref 6–20)
CHLORIDE: 94 mmol/L — AB (ref 101–111)
CO2: 30 mmol/L (ref 22–32)
Calcium: 9.7 mg/dL (ref 8.9–10.3)
Creatinine, Ser: 0.92 mg/dL (ref 0.61–1.24)
GFR calc Af Amer: >60 mL/min (ref >60)
GFR calc non Af Amer: >60 mL/min (ref >60)
Glucose, Bld: 100 mg/dL — ABNORMAL HIGH (ref 65–99)
POTASSIUM: 4.4 mmol/L (ref 3.5–5.1)
SODIUM: 132 mmol/L — AB (ref 135–145)

## 2016-08-26 LAB — CBC WITH DIFFERENTIAL/PLATELET
BASOS ABS: 0 10*3/uL (ref 0.0–0.1)
BASOS PCT: 0 %
EOS PCT: 2 %
Eosinophils Absolute: 0.5 10*3/uL (ref 0.0–0.7)
HEMATOCRIT: 43.6 % (ref 39.0–52.0)
HEMOGLOBIN: 15.1 g/dL (ref 13.0–17.0)
LYMPHS PCT: 16 %
Lymphs Abs: 4.1 10*3/uL — ABNORMAL HIGH (ref 0.7–4.0)
MCH: 30.6 pg (ref 26.0–34.0)
MCHC: 34.6 g/dL (ref 30.0–36.0)
MCV: 88.3 fL (ref 78.0–100.0)
MONOS PCT: 8 %
Monocytes Absolute: 2 10*3/uL — ABNORMAL HIGH (ref 0.1–1.0)
NEUTROS ABS: 19 10*3/uL — AB (ref 1.7–7.7)
Neutrophils Relative %: 74 %
Platelets: 229 10*3/uL (ref 150–400)
RBC: 4.94 MIL/uL (ref 4.22–5.81)
RDW: 12.6 % (ref 11.5–15.5)
WBC: 25.6 10*3/uL — ABNORMAL HIGH (ref 4.0–10.5)

## 2016-08-26 LAB — I-STAT CG4 LACTIC ACID, ED: Lactic Acid, Venous: 1.1 mmol/L (ref 0.5–1.9)

## 2016-08-26 LAB — PROTIME-INR
INR: 1.07
Prothrombin Time: 13.9 seconds (ref 11.4–15.2)

## 2016-08-26 MED ORDER — SODIUM CHLORIDE 0.9 % IV BOLUS (SEPSIS)
1000.0000 mL | Freq: Once | INTRAVENOUS | Status: AC
  Administered 2016-08-26: 1000 mL via INTRAVENOUS

## 2016-08-26 MED ORDER — IOPAMIDOL (ISOVUE-300) INJECTION 61%
INTRAVENOUS | Status: AC
  Filled 2016-08-26: qty 75

## 2016-08-26 MED ORDER — RACEPINEPHRINE HCL 2.25 % IN NEBU
0.5000 mL | INHALATION_SOLUTION | Freq: Once | RESPIRATORY_TRACT | Status: AC
  Administered 2016-08-26: 0.5 mL via RESPIRATORY_TRACT
  Filled 2016-08-26: qty 0.5

## 2016-08-26 MED ORDER — CLINDAMYCIN PHOSPHATE 900 MG/50ML IV SOLN
900.0000 mg | Freq: Once | INTRAVENOUS | Status: AC
  Administered 2016-08-26: 900 mg via INTRAVENOUS
  Filled 2016-08-26: qty 50

## 2016-08-26 MED ORDER — IOPAMIDOL (ISOVUE-300) INJECTION 61%
75.0000 mL | Freq: Once | INTRAVENOUS | Status: AC | PRN
  Administered 2016-08-26: 75 mL via INTRAVENOUS

## 2016-08-26 MED ORDER — HYDROCODONE-ACETAMINOPHEN 5-325 MG PO TABS: 1.0000 | ORAL_TABLET | ORAL | 0 refills | Status: DC | PRN

## 2016-08-26 MED ORDER — CLINDAMYCIN HCL 150 MG PO CAPS: 450.0000 mg | ORAL_CAPSULE | Freq: Three times a day (TID) | ORAL | 0 refills | Status: AC

## 2016-08-26 MED ORDER — MORPHINE SULFATE (PF) 4 MG/ML IV SOLN
4.0000 mg | Freq: Once | INTRAVENOUS | Status: AC
  Administered 2016-08-26: 4 mg via INTRAVENOUS
  Filled 2016-08-26: qty 1

## 2016-08-26 MED ORDER — DEXAMETHASONE SODIUM PHOSPHATE 10 MG/ML IJ SOLN
10.0000 mg | Freq: Once | INTRAMUSCULAR | Status: AC
  Administered 2016-08-26: 10 mg via INTRAVENOUS
  Filled 2016-08-26: qty 1

## 2016-08-26 MED ORDER — CLINDAMYCIN HCL 300 MG PO CAPS
450.0000 mg | ORAL_CAPSULE | Freq: Once | ORAL | Status: AC
  Administered 2016-08-26: 450 mg via ORAL
  Filled 2016-08-26: qty 1

## 2016-08-26 NOTE — Consult Note (Signed)
Reason for Consult: Peritonsillar abscesses Referring Physician: ER  Joel Murphy is an 31 y.o. male.  HPI: 31 year old male with six days of sore throat and hoarseness that has worsened to the point that he feels his throat is closing.  He is having difficulty swallowing solid food but has been drinking liquids.  Pain is severe and is worse on th right with radiation to his ears.  His voice is muffled and he has felt feverish with chills.  He presented to the ER where a CT scan demonstrated bilateral peritonsillar abscesses.  History reviewed. No pertinent past medical history.  History reviewed. No pertinent surgical history.  History reviewed. No pertinent family history.  Social History:  reports that he has been smoking Cigarettes.  He has a 2.50 pack-year smoking history. He has quit using smokeless tobacco. His smokeless tobacco use included Chew. He reports that he does not drink alcohol or use drugs.  Allergies: No Known Allergies  Medications: I have reviewed the patient's current medications.  Results for orders placed or performed during the hospital encounter of 08/25/16 (from the past 48 hour(s))  Rapid strep screen     Status: Abnormal   Collection Time: 08/25/16 11:57 PM  Result Value Ref Range   Streptococcus, Group A Screen (Direct) POSITIVE (A) NEGATIVE  CBC with Differential/Platelet     Status: Abnormal   Collection Time: 08/26/16 12:46 AM  Result Value Ref Range   WBC 25.6 (H) 4.0 - 10.5 K/uL   RBC 4.94 4.22 - 5.81 MIL/uL   Hemoglobin 15.1 13.0 - 17.0 g/dL   HCT 43.6 39.0 - 52.0 %   MCV 88.3 78.0 - 100.0 fL   MCH 30.6 26.0 - 34.0 pg   MCHC 34.6 30.0 - 36.0 g/dL   RDW 12.6 11.5 - 15.5 %   Platelets 229 150 - 400 K/uL   Neutrophils Relative % 74 %   Lymphocytes Relative 16 %   Monocytes Relative 8 %   Eosinophils Relative 2 %   Basophils Relative 0 %   Neutro Abs 19.0 (H) 1.7 - 7.7 K/uL   Lymphs Abs 4.1 (H) 0.7 - 4.0 K/uL   Monocytes Absolute 2.0 (H) 0.1  - 1.0 K/uL   Eosinophils Absolute 0.5 0.0 - 0.7 K/uL   Basophils Absolute 0.0 0.0 - 0.1 K/uL   WBC Morphology WHITE COUNT CONFIRMED ON SMEAR    Smear Review MORPHOLOGY UNREMARKABLE   Basic metabolic panel     Status: Abnormal   Collection Time: 08/26/16 12:46 AM  Result Value Ref Range   Sodium 132 (L) 135 - 145 mmol/L   Potassium 4.4 3.5 - 5.1 mmol/L   Chloride 94 (L) 101 - 111 mmol/L   CO2 30 22 - 32 mmol/L   Glucose, Bld 100 (H) 65 - 99 mg/dL   BUN 14 6 - 20 mg/dL   Creatinine, Ser 0.92 0.61 - 1.24 mg/dL   Calcium 9.7 8.9 - 10.3 mg/dL   GFR calc non Af Amer >60 >60 mL/min   GFR calc Af Amer >60 >60 mL/min    Comment: (NOTE) The eGFR has been calculated using the CKD EPI equation. This calculation has not been validated in all clinical situations. eGFR's persistently <60 mL/min signify possible Chronic Kidney Disease.    Anion gap 8 5 - 15  Protime-INR     Status: None   Collection Time: 08/26/16 12:46 AM  Result Value Ref Range   Prothrombin Time 13.9 11.4 - 15.2 seconds  INR 1.07   I-Stat CG4 Lactic Acid, ED     Status: None   Collection Time: 08/26/16 12:58 AM  Result Value Ref Range   Lactic Acid, Venous 1.10 0.5 - 1.9 mmol/L    Ct Soft Tissue Neck W Contrast  Result Date: 08/26/2016 CLINICAL DATA:  Initial evaluation for worsening sore throat. EXAM: CT NECK WITH CONTRAST TECHNIQUE: Multidetector CT imaging of the neck was performed using the standard protocol following the bolus administration of intravenous contrast. CONTRAST:  71m ISOVUE-300 IOPAMIDOL (ISOVUE-300) INJECTION 61% COMPARISON:  None available. FINDINGS: Pharynx and larynx: Oral cavity within normal limits. No acute abnormality about the dentition. [place] tonsils are enlarged and hyperenhancing bilaterally, compatible with acute tonsillitis. Tonsils abut at the midline. There are superimposed bilateral tonsillar/ peritonsillar abscesses. Abscess on the right is somewhat irregular measuring 2.7 x 2.4 x  3.3 cm in greatest dimensions (transverse by AP by craniocaudad). Abscess on the left is smaller and measures 1.2 x 2.2 x 2.4 cm in greatest dimensions (transverse by AP by craniocaudad). Associated or pharyngeal mucosal edema with hazy inflammatory stranding within the bilateral parapharyngeal spaces. Adenoidal soft tissues somewhat prominent as well at the level of the nasopharynx. Small layering retropharyngeal effusion, likely reactive. Epiglottis itself within normal limits. Vallecula clear. Pharyngeal mucosa extends inferiorly within the hypopharynx to nearly the level the piriform sinuses. Piriform sinuses themselves are clear. True cords symmetric and within normal limits. Subglottic airway clear. Salivary glands: Parotid and submandibular glands within normal limits. Thyroid: Thyroid normal. Lymph nodes: Enlarged bilateral level 2 lymph nodes measure up to 18 mm on the right and 19 mm on the left, likely reactive. Additional mildly prominent shotty subcentimeter nodes seen within the neck bilaterally, also likely reactive. Vascular: Normal intravascular enhancement seen throughout the neck. Limited intracranial: Unremarkable. Visualized orbits: Visualized globes and orbits within normal limits. Mastoids and visualized paranasal sinuses: Visualized paranasal sinuses are clear. Mastoids and middle ear cavities are clear. Skeleton: Visualized osseous structures within normal limits. Upper chest: Visualized upper mediastinum within normal limits. Visualized lungs are clear. Other: No other significant finding. IMPRESSION: 1. Findings consistent with acute tonsillitis with associated bilateral tonsillar/peritonsillar abscesses, measuring 2.7 x 2.4 x 3.3 cm on the right, and 1.2 x 2.2 x 2.4 cm on the left. Oropharyngeal mucosal edema compatible with associated pharyngitis. 2. Enlarged level 2 adenopathy, likely reactive. Electronically Signed   By: BJeannine BogaM.D.   On: 08/26/2016 02:47    Review of  Systems  Constitutional: Positive for chills and fever.  HENT: Positive for sore throat.   Respiratory: Positive for stridor.   All other systems reviewed and are negative.  Blood pressure 127/76, pulse 75, temperature 98 F (36.7 C), temperature source Oral, resp. rate 17, height 5' 11"  (1.803 m), weight 165 lb (74.8 kg), SpO2 95 %. Physical Exam  Constitutional: He is oriented to person, place, and time. He appears well-developed and well-nourished. No distress.  HENT:  Head: Normocephalic and atraumatic.  Right Ear: External ear normal.  Left Ear: External ear normal.  Nose: Nose normal.  Mouth/Throat: Oropharynx is clear and moist.  Markedly enlarged tonsils obstructing oropharynx fully with right greater than left peritonsillar fullness.  Mild trismus.  Muffled voice.  Eyes: Conjunctivae and EOM are normal. Pupils are equal, round, and reactive to light.  Neck: Normal range of motion. Neck supple.  Bilateral enlarged zone 2 nodes.  Cardiovascular: Normal rate.   Respiratory: Effort normal.  Musculoskeletal: Normal range of motion.  Neurological: He  is alert and oriented to person, place, and time. No cranial nerve deficit.  Skin: Skin is warm and dry.  Psychiatric: He has a normal mood and affect. His behavior is normal. Judgment and thought content normal.    Assessment/Plan: Bilateral peritonsillar abscess I personally reviewed his neck CT.  WBC is 25.6.  I recommended and performed drainage of bilateral peritonsillar abscesses.  See procedure note.  He felt immediate relief.  I discussed his case with the ER staff who will observe him for a few hours before potential discharge on oral clindamycin 300 mg qid for 10 days.  He can use Motrin and Tylenol for pain control as his pain should be much better now.  He can follow-up with me mid-week next week.  Amarri Satterly 08/26/2016, 4:10 AM

## 2016-08-26 NOTE — ED Provider Notes (Signed)
MSE was initiated and I personally evaluated the patient and placed orders (if any) at  12:08 AM on August 26, 2016.  Patient with sore throat.  Has had gradually worsening symptoms over the past couple of days.  Reports fevers and chills.  Has not taken anything for his symptoms.  HEENT: bilateral tonsillar swelling concerning for abscess, +hot potato voice, no stridor, tolerating secretions  Will start IV clinda, decadron, and get CT.  The patient appears stable so that the remainder of the MSE may be completed by another provider.   Roxy Horsemanobert Maahi Lannan, PA-C 08/26/16 0010    Raeford RazorStephen Kohut, MD 09/05/16 [phone]

## 2016-08-26 NOTE — Procedures (Signed)
Preop diagnosis: Bilateral peritonsillar abscess Postop diagnosis: same Procedure: Incision and drainage of bilateral peritonsillar abscess Surgeon: Jenne PaneBates Anesth: Topical with cetacaine and local with 1% lidocaine with 1:100,000 epinephrine Compl: None Findings: Large pocket of pus drained from right side.  2 cc of pus aspirated from left side. Description:  After discussing risks, benefits, and alternatives, the patient was placed in a seated position and the oropharynx was sprayed on both sides with topical anesthetic.  The peritonsillar area was then injected with local anesthetic on both sides.  Incisions were made above each tonsil using an 11 blade scalpel.  A tonsil clamp was then used to dissect on the right into the abscess cavity and pus flowed freely.  On the left, the clamp was used but the pocket was not immediately entered.  The abscess was found then using an 18 gauge needle on a 10 cc syringe, aspirating 2 cc of pus.  The same path was then opened with the tonsil clamp.  He tolerated the procedure well and was returned to nursing care in stable condition.

## 2016-08-26 NOTE — Discharge Instructions (Signed)
Please take your antibiotics 3 times a day as prescribed to treat the continuing infection in your throat. She began having difficulty breathing, worsened pain swallowing, or you feel like her throat is closing, please return to the nearest emergency department. Please take the pain medicine as needed to help with your discomfort. Please follow-up with the ENT clinic for further management. If any symptoms worsen, please return.

## 2016-08-26 NOTE — ED Notes (Signed)
Patient transferred to Res B for closer observation. Patient is awake and alert/speech is very muffled-O2 sat 100%. Patient placed on monitor and continuous pulse ox. RT at bedside administering Racemic Epi neb.

## 2016-08-26 NOTE — ED Notes (Signed)
Patient to CT and returned.

## 2016-08-26 NOTE — ED Notes (Addendum)
Pt ambulatory to restroom. Speaking in complete sentences.

## 2016-08-26 NOTE — ED Provider Notes (Signed)
[facility]-EMERGENCY DEPT Provider Note   CSN: [id number] Arrival date & time: 08/25/16  2332  By signing my name below, I, Joel Murphy, attest that this documentation has been prepared under the direction and in the presence of Heide Scales, MD. Electronically Signed: Elder Murphy, Scribe. 08/26/16. 12:31 AM.   History   Chief Complaint Chief Complaint  Patient presents with  . Sore Throat    HPI Joel Murphy is a 31 y.o. male without any chronic medical problems who presents to the ED for evaluation of sore throat, throat swelling, intolerance of PO, and sensation of throat closing. This patient states that for the last 6 days he has experienced sore throat with hoarseness and sensation of throat closure which worsened today. He does report difficulty respirating especially when lying supine. He is having difficulty swallowing solid foods; he can tolerate liquids and his own saliva. He is also reporting 2 days of cough, congestion, and bilateral chest pain worse with breathing. Throat pain is 10/10 in severity. 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 Last use was several hours ago. He denies any complications with his injection sites.   The history is provided by the patient. No language interpreter was used.  Sore Throat  The current episode started more than 2 days ago. The problem occurs constantly. The problem has been gradually worsening. Associated symptoms include chest pain and headaches. Pertinent negatives include no shortness of breath. Associated symptoms comments: No fever.. The symptoms are aggravated by swallowing and eating. Nothing relieves the symptoms. He has tried nothing for the symptoms.    History reviewed. No pertinent past medical history.  There are no active problems to display for this patient.   History reviewed. No pertinent surgical history.     Home Medications    Prior to Admission medications   Not on File     Family History History reviewed. No pertinent family history.  Social History Social History  Substance Use Topics  . Smoking status: Current Every Day Smoker    Packs/day: 0.25    Years: 10.00    Types: Cigarettes  . Smokeless tobacco: Former Neurosurgeon    Types: Chew  . Alcohol use No     Allergies   Patient has no known allergies.   Review of Systems Review of Systems  Constitutional: Negative for fever.  HENT:       Sore throat. Sensation of throat closure. Hoarseness. Difficulty swallowing. Difficulty respirating when lying flat.   Respiratory: Negative for shortness of breath.   Cardiovascular: Positive for chest pain.  Neurological: Positive for headaches.  All other systems reviewed and are negative.    Physical Exam Updated Vital Signs BP 133/89 (BP Location: Right Arm)   Pulse 93   Temp 98 F (36.7 C) (Oral)   Resp 24   Ht 5\' 11"  (1.803 m)   Wt 165 lb (74.8 kg)   SpO2 100%   BMI 23.01 kg/m   Physical Exam  Constitutional: He appears well-developed and well-nourished. He appears distressed.  HENT:  Head: Normocephalic and atraumatic.  Mouth/Throat: Posterior oropharyngeal edema, posterior oropharyngeal erythema and tonsillar abscesses present. Tonsils are 4+ on the right. Tonsils are 4+ on the left.  Trismus present. Pain on palpation of the throat and ROM of the neck. Tonsil size grade 4. Oropharyngeal edema in airway with tonsils touching. Uvula unable to be visualized. No stridor.   Eyes: Conjunctivae and EOM are normal. Pupils are equal, round, and  reactive to light.  Neck: Neck supple.  Cardiovascular: Normal rate and regular rhythm.   No murmur heard. Pulmonary/Chest: Effort normal and breath sounds normal. No stridor. No respiratory distress. He has no wheezes. He exhibits no tenderness.  Abdominal: Soft. He exhibits no distension. There is no tenderness.  Musculoskeletal: He exhibits no edema or tenderness.  Lymphadenopathy:    He has  cervical adenopathy.  Neurological: He is alert. No sensory deficit.  Skin: Skin is warm and dry. Capillary refill takes less than 2 seconds. He is not diaphoretic. No pallor.  Track marks present.   Psychiatric: He has a normal mood and affect.  Nursing note and vitals reviewed.       ED Treatments / Results  DIAGNOSTIC STUDIES: Oxygen Saturation is 100 percent on room air which is normal by my interpretation.    COORDINATION OF CARE: 12:34 AM Discussed treatment plan with pt at bedside and pt agreed to plan.  Labs (all labs ordered are listed, but only abnormal results are displayed) Labs Reviewed  RAPID STREP SCREEN (NOT AT [facility]) - Abnormal; Notable for the following:       Result Value   Streptococcus, Group A Screen (Direct) POSITIVE (*)    All other components within normal limits  CBC WITH DIFFERENTIAL/PLATELET - Abnormal; Notable for the following:    WBC 25.6 (*)    Neutro Abs 19.0 (*)    Lymphs Abs 4.1 (*)    Monocytes Absolute 2.0 (*)    All other components within normal limits  BASIC METABOLIC PANEL - Abnormal; Notable for the following:    Sodium 132 (*)    Chloride 94 (*)    Glucose, Bld 100 (*)    All other components within normal limits  CULTURE, BLOOD (ROUTINE X 2)  CULTURE, BLOOD (ROUTINE X 2)  PROTIME-INR  I-STAT CG4 LACTIC ACID, ED    EKG  EKG Interpretation None       Radiology Ct Soft Tissue Neck W Contrast  Result Date: 08/26/2016 CLINICAL DATA:  Initial evaluation for worsening sore throat. EXAM: CT NECK WITH CONTRAST TECHNIQUE: Multidetector CT imaging of the neck was performed using the standard protocol following the bolus administration of intravenous contrast. CONTRAST:  75mL ISOVUE-300 IOPAMIDOL (ISOVUE-300) INJECTION 61% COMPARISON:  None available. FINDINGS: Pharynx and larynx: Oral cavity within normal limits. No acute abnormality about the dentition. Palatine tonsils are enlarged and hyperenhancing bilaterally, compatible with  acute tonsillitis. Tonsils abut at the midline. There are superimposed bilateral tonsillar/ peritonsillar abscesses. Abscess on the right is somewhat irregular measuring 2.7 x 2.4 x 3.3 cm in greatest dimensions (transverse by AP by craniocaudad). Abscess on the left is smaller and measures 1.2 x 2.2 x 2.4 cm in greatest dimensions (transverse by AP by craniocaudad). Associated or pharyngeal mucosal edema with hazy inflammatory stranding within the bilateral parapharyngeal spaces. Adenoidal soft tissues somewhat prominent as well at the level of the nasopharynx. Small layering retropharyngeal effusion, likely reactive. Epiglottis itself within normal limits. Vallecula clear. Pharyngeal mucosa extends inferiorly within the hypopharynx to nearly the level the piriform sinuses. Piriform sinuses themselves are clear. True cords symmetric and within normal limits. Subglottic airway clear. Salivary glands: Parotid and submandibular glands within normal limits. Thyroid: Thyroid normal. Lymph nodes: Enlarged bilateral level 2 lymph nodes measure up to 18 mm on the right and 19 mm on the left, likely reactive. Additional mildly prominent shotty subcentimeter nodes seen within the neck bilaterally, also likely reactive. Vascular: Normal intravascular enhancement seen throughout  the neck. Limited intracranial: Unremarkable. Visualized orbits: Visualized globes and orbits within normal limits. Mastoids and visualized paranasal sinuses: Visualized paranasal sinuses are clear. Mastoids and middle ear cavities are clear. Skeleton: Visualized osseous structures within normal limits. Upper chest: Visualized upper mediastinum within normal limits. Visualized lungs are clear. Other: No other significant finding. IMPRESSION: 1. Findings consistent with acute tonsillitis with associated bilateral tonsillar/peritonsillar abscesses, measuring 2.7 x 2.4 x 3.3 cm on the right, and 1.2 x 2.2 x 2.4 cm on the left. Oropharyngeal mucosal edema  compatible with associated pharyngitis. 2. Enlarged level 2 adenopathy, likely reactive. Electronically Signed   By: Rise Mu M.D.   On: 08/26/2016 02:47    Procedures Procedures (including critical care time)  CRITICAL CARE Performed by: Canary Brim Dantre Yearwood Total critical care time: 45 minutes Critical care time was exclusive of separately billable procedures and treating other patients. Critical care was necessary to treat or prevent imminent or life-threatening deterioration. Critical care was time spent personally by me on the following activities: development of treatment plan with patient and/or surrogate as well as nursing, discussions with consultants, evaluation of patient's response to treatment, examination of patient, obtaining history from patient or surrogate, ordering and performing treatments and interventions, ordering and review of laboratory studies, ordering and review of radiographic studies, pulse oximetry and re-evaluation of patient's condition.   Medications Ordered in ED Medications  iopamidol (ISOVUE-300) 61 % injection (not administered)  clindamycin (CLEOCIN) capsule 450 mg (not administered)  dexamethasone (DECADRON) injection 10 mg (10 mg Intravenous Given 08/26/16 0046)  clindamycin (CLEOCIN) IVPB 900 mg (0 mg Intravenous Stopped 08/26/16 0106)  morphine 4 MG/ML injection 4 mg (4 mg Intravenous Given 08/26/16 0118)  Racepinephrine HCl 2.25 % nebulizer solution 0.5 mL (0.5 mLs Nebulization Given 08/26/16 0103)  iopamidol (ISOVUE-300) 61 % injection 75 mL (75 mLs Intravenous Contrast Given 08/26/16 0200)  sodium chloride 0.9 % bolus 1,000 mL (0 mLs Intravenous Stopped 08/26/16 0525)     Initial Impression / Assessment and Plan / ED Course  I have reviewed the triage vital signs and the nursing notes.  Pertinent labs & imaging results that were available during my care of the patient were reviewed by me and considered in my medical decision making  (see chart for details).     Juel Bellerose is a 31 y.o. male without any chronic medical problems who presents to the ED for evaluation of sore throat, throat swelling, intolerance of PO, and sensation of throat closing.   History and exam are seen above.  While in triage, patient was quickly evaluated and found to have throat closing. Patient given Decadron, clindamycin, and a CT scan was ordered to further evaluate.  On my initial evaluation, patient had very swollen oropharynx. Uvula was not visualized as tonsils were touching. Patient had erythema and edema in the oropharynx. Patient had no stridor, and no difficulty breathing but clearly had a swollen throat. Patient had some tenderness in his throat. Patient had full range motion of his neck with only mild pain. Patient had no focal neurologic deficits and lungs are clear.  Due to concern for neck infection, patient had CT scan showing bilateral peritonsillar abscess. Patient was given his clindamycin as well as Decadron. Given the significant swelling visualized, patient given racemic epinephrine which helped his symptoms significantly. A shunt had laboratory testing showing evidence of leukocytosis at 25.6. Lactic acid nonelevated. Strep swab was positive.  After bilateral peritonsillar abscesses were seen, ENT was called. ENT did a  bedside drainage of both sides. Patient had significant amount of pus removed. After this, patient felt much better and his voice was no longer hoarse. Patient felt he could tolerate oral fluids, food, and medications. Patient was observed for several hours to make sure he did not have a rebound edema or worsened symptoms.  Patient did well in ED. Patient will follow-up with otolaryngology in several days. Patient understood strict return precautions for any new or worsening symptoms. Patient given pain medicine for his discomfort as well as medications. I will laryngology recommended clindamycin 3 times a  day.  Patient had no other questions or concerns and patient was discharged in good condition with significant improvement in his presenting symptoms.    Final Clinical Impressions(s) / ED Diagnoses   Final diagnoses:  Peritonsillar abscess  Strep pharyngitis  Sore throat    New Prescriptions New Prescriptions   CLINDAMYCIN (CLEOCIN) 150 MG CAPSULE    Take 3 capsules (450 mg total) by mouth 3 (three) times daily.   HYDROCODONE-ACETAMINOPHEN (NORCO/VICODIN) 5-325 MG TABLET    Take 1 tablet by mouth every 4 (four) hours as needed.   I personally performed the services described in this documentation, which was scribed in my presence. The recorded information has been reviewed and is accurate.  Clinical Impression: 1. Peritonsillar abscess   2. Strep pharyngitis   3. Sore throat     Disposition: Discharge  Condition: Good  I have discussed the results, Dx and Tx plan with the pt(& family if present). He/she/they expressed understanding and agree(s) with the plan. Discharge instructions discussed at great length. Strict return precautions discussed and pt &/or family have verbalized understanding of the instructions. No further questions at time of discharge.    New Prescriptions   CLINDAMYCIN (CLEOCIN) 150 MG CAPSULE    Take 3 capsules (450 mg total) by mouth 3 (three) times daily.   HYDROCODONE-ACETAMINOPHEN (NORCO/VICODIN) 5-325 MG TABLET    Take 1 tablet by mouth every 4 (four) hours as needed.    Follow Up: Christia Reading, MD [address] [place] [address] [phone]  Schedule an appointment as soon as possible for a visit on 08/30/2016   [facility]ENC[facility] DEPT [address] [id number] mc [address] [place] [address] [phone]  If symptoms worsen     Heide Scales, MD 08/26/16 [phone]

## 2016-08-26 NOTE — ED Notes (Signed)
Pt tolerating po fluids and applesauce

## 2016-08-26 NOTE — ED Notes (Signed)
Pt asleep.

## 2016-08-31 LAB — CULTURE, BLOOD (ROUTINE X 2)
Culture: NO GROWTH
Culture: NO GROWTH

## 2016-09-16 ENCOUNTER — Encounter (HOSPITAL_COMMUNITY): Payer: Self-pay

## 2016-09-16 ENCOUNTER — Emergency Department (HOSPITAL_COMMUNITY)
Admission: EM | Admit: 2016-09-16 | Discharge: 2016-09-16 | Disposition: A | Discharge status: Home / Self Care | Payer: Managed Care, Other (non HMO) | Attending: Emergency Medicine | Admitting: Emergency Medicine

## 2016-09-16 DIAGNOSIS — F17210 Nicotine dependence, cigarettes, uncomplicated: Secondary | ICD-10-CM | POA: Insufficient documentation

## 2016-09-16 DIAGNOSIS — F10129 Alcohol abuse with intoxication, unspecified: Secondary | ICD-10-CM | POA: Insufficient documentation

## 2016-09-16 DIAGNOSIS — F1010 Alcohol abuse, uncomplicated: Secondary | ICD-10-CM

## 2016-09-16 NOTE — ED Notes (Signed)
Pt tolerated ambulation well. He was independent and denied feeling like he would fall.

## 2016-09-16 NOTE — ED Provider Notes (Signed)
[facility]-EMERGENCY DEPT Provider Note   CSN: [id number] Arrival date & time: 09/16/16  0021  By signing my name below, I, Nelwyn Salisbury, attest that this documentation has been prepared under the direction and in the presence of Zadie Rhine, MD. Electronically Signed: Nelwyn Salisbury, Scribe. 09/16/2016. 12:47 AM.  History   Chief Complaint Chief Complaint  Patient presents with  . Alcohol Intoxication   The history is provided by the patient. No language interpreter was used.  Alcohol Intoxication  This is a new problem. The current episode started 1 to 2 hours ago. The problem occurs constantly. The problem has not changed since onset.Pertinent negatives include no chest pain and no abdominal pain. Nothing aggravates the symptoms. Nothing relieves the symptoms. He has tried nothing for the symptoms.    HPI Comments:  Joel Murphy is an otherwise healthy 31 y.o. male who presents to the Emergency Department by police after being found at a nearby gas station heavily intoxicated 1-2 hours ago. Pt states that he does not remember anything that has happened tonight but his intoxication is gradually resolving. No associated symptoms. He denies any chest pain, abdominal pain or any other symptoms.     PMH - none Soc hx - etoh use Home Medications    Prior to Admission medications   Medication Sig Start Date End Date Taking? Authorizing Provider  HYDROcodone-acetaminophen (NORCO/VICODIN) 5-325 MG tablet Take 1 tablet by mouth every 4 (four) hours as needed. 08/26/16   Heide Scales, MD    Family History No family history on file.  Social History Social History  Substance Use Topics  . Smoking status: Current Every Day Smoker    Packs/day: 0.25    Years: 10.00    Types: Cigarettes  . Smokeless tobacco: Former Neurosurgeon    Types: Chew  . Alcohol use No     Allergies   Patient has no known allergies.   Review of Systems Review of Systems  Cardiovascular: Negative for  chest pain.  Gastrointestinal: Negative for abdominal pain.     Physical Exam Updated Vital Signs BP 130/71 (BP Location: Left Arm)   Pulse 98   Temp 97.9 F (36.6 C) (Oral)   Resp 20   SpO2 97%   Physical Exam CONSTITUTIONAL: Disheveled, Pt sleeping on arrival but wakes up easily HEAD: Normocephalic/atraumatic EYES: EOMI/PERRL ENMT: Mucous membranes moist NECK: supple no meningeal signs SPINE/BACK:entire spine nontender CV: S1/S2 noted, no murmurs/rubs/gallops noted LUNGS: Lungs are clear to auscultation bilaterally, no apparent distress ABDOMEN: soft, nontender NEURO: Pt is awake/alert/appropriate, moves all extremitiesx4.  No facial droop.   EXTREMITIES: pulses normal/equal, full ROM, no signs of trauma SKIN: warm, color normal PSYCH: no abnormalities of mood noted, alert and oriented to situation   ED Treatments / Results  DIAGNOSTIC STUDIES:  Oxygen Saturation is 97% on RA, normal by my interpretation.    COORDINATION OF CARE:  1:00 AM Discussed treatment plan with pt at bedside which includes monitoring and pt agreed to plan.  Labs (all labs ordered are listed, but only abnormal results are displayed) Labs Reviewed - No data to display  EKG  EKG Interpretation None       Radiology No results found.  Procedures Procedures (including critical care time)  Medications Ordered in ED Medications - No data to display   Initial Impression / Assessment and Plan / ED Course  I have reviewed the triage vital signs and the nursing notes.       Pt stable He is  ambulatory No signs of trauma Here only for ETOH intox He has steady gait Will d/c home Pt comfortable with plan  Final Clinical Impressions(s) / ED Diagnoses   Final diagnoses:  ETOH abuse    New Prescriptions New Prescriptions   No medications on file  I personally performed the services described in this documentation, which was scribed in my presence. The recorded information has  been reviewed and is accurate.        Zadie Rhine, MD 09/16/16 [phone]

## 2016-09-16 NOTE — ED Notes (Addendum)
Pt has no memory of what he has has done previously. Found at a [employer] [place]. Police were at the scene.

## 2016-09-16 NOTE — ED Notes (Signed)
Pt ambulatory to room.

## 2016-09-16 NOTE — ED Notes (Signed)
Pt ambulated without assistance. States he is just tired and wants to go back to sleep.pt given cup of water as requested.

## 2016-09-16 NOTE — ED Triage Notes (Signed)
Pt reports getting blackout drunk, he now remembers what happens.

## 2020-12-27 ENCOUNTER — Emergency Department (HOSPITAL_COMMUNITY): Payer: Self-pay

## 2020-12-27 ENCOUNTER — Inpatient Hospital Stay (HOSPITAL_COMMUNITY)
Admission: EM | Admit: 2020-12-27 | Discharge: 2020-12-29 | DRG: 345 | Disposition: A | Discharge status: Home / Self Care | Payer: Self-pay | Attending: General Surgery | Admitting: General Surgery

## 2020-12-27 ENCOUNTER — Encounter (HOSPITAL_COMMUNITY): Payer: Self-pay | Admitting: Emergency Medicine

## 2020-12-27 DIAGNOSIS — D649 Anemia, unspecified: Secondary | ICD-10-CM | POA: Diagnosis present

## 2020-12-27 DIAGNOSIS — L0231 Cutaneous abscess of buttock: Secondary | ICD-10-CM | POA: Diagnosis present

## 2020-12-27 DIAGNOSIS — L0291 Cutaneous abscess, unspecified: Secondary | ICD-10-CM

## 2020-12-27 DIAGNOSIS — F1011 Alcohol abuse, in remission: Secondary | ICD-10-CM | POA: Diagnosis present

## 2020-12-27 DIAGNOSIS — Z20822 Contact with and (suspected) exposure to covid-19: Secondary | ICD-10-CM | POA: Diagnosis present

## 2020-12-27 DIAGNOSIS — K611 Rectal abscess: Principal | ICD-10-CM | POA: Diagnosis present

## 2020-12-27 DIAGNOSIS — F17210 Nicotine dependence, cigarettes, uncomplicated: Secondary | ICD-10-CM | POA: Diagnosis present

## 2020-12-27 LAB — COMPREHENSIVE METABOLIC PANEL
ALT: 44 U/L (ref 0–44)
AST: 29 U/L (ref 15–41)
Albumin: 3.3 g/dL — ABNORMAL LOW (ref 3.5–5.0)
Alkaline Phosphatase: 53 U/L (ref 38–126)
Anion gap: 5 (ref 5–15)
BUN: 9 mg/dL (ref 6–20)
CO2: 26 mmol/L (ref 22–32)
Calcium: 8.8 mg/dL — ABNORMAL LOW (ref 8.9–10.3)
Chloride: 105 mmol/L (ref 98–111)
Creatinine, Ser: 0.79 mg/dL (ref 0.61–1.24)
GFR, Estimated: >60 mL/min (ref >60)
Glucose, Bld: 148 mg/dL — ABNORMAL HIGH (ref 70–99)
Potassium: 3.7 mmol/L (ref 3.5–5.1)
Sodium: 136 mmol/L (ref 135–145)
Total Bilirubin: 0.4 mg/dL (ref 0.3–1.2)
Total Protein: 7.1 g/dL (ref 6.5–8.1)

## 2020-12-27 LAB — BASIC METABOLIC PANEL
Anion gap: 5 (ref 5–15)
BUN: 11 mg/dL (ref 6–20)
CO2: 28 mmol/L (ref 22–32)
Calcium: 9.3 mg/dL (ref 8.9–10.3)
Chloride: 105 mmol/L (ref 98–111)
Creatinine, Ser: 0.79 mg/dL (ref 0.61–1.24)
GFR, Estimated: >60 mL/min (ref >60)
Glucose, Bld: 108 mg/dL — ABNORMAL HIGH (ref 70–99)
Potassium: 4.4 mmol/L (ref 3.5–5.1)
Sodium: 138 mmol/L (ref 135–145)

## 2020-12-27 LAB — CBC WITH DIFFERENTIAL/PLATELET
Abs Immature Granulocytes: 0.05 10*3/uL (ref 0.00–0.07)
Basophils Absolute: 0 10*3/uL (ref 0.0–0.1)
Basophils Relative: 0 %
Eosinophils Absolute: 0.1 10*3/uL (ref 0.0–0.5)
Eosinophils Relative: 0 %
HCT: 42.6 % (ref 39.0–52.0)
Hemoglobin: 14.3 g/dL (ref 13.0–17.0)
Immature Granulocytes: 0 %
Lymphocytes Relative: 13 %
Lymphs Abs: 1.6 10*3/uL (ref 0.7–4.0)
MCH: 30.2 pg (ref 26.0–34.0)
MCHC: 33.6 g/dL (ref 30.0–36.0)
MCV: 89.9 fL (ref 80.0–100.0)
Monocytes Absolute: 0.8 10*3/uL (ref 0.1–1.0)
Monocytes Relative: 7 %
Neutro Abs: 9.6 10*3/uL — ABNORMAL HIGH (ref 1.7–7.7)
Neutrophils Relative %: 80 %
Platelets: 155 10*3/uL (ref 150–400)
RBC: 4.74 MIL/uL (ref 4.22–5.81)
RDW: 12.8 % (ref 11.5–15.5)
WBC: 12.1 10*3/uL — ABNORMAL HIGH (ref 4.0–10.5)
nRBC: 0 % (ref 0.0–0.2)

## 2020-12-27 LAB — HIV ANTIBODY (ROUTINE TESTING W REFLEX): HIV Screen 4th Generation wRfx: NONREACTIVE

## 2020-12-27 LAB — CBC
HCT: 39.2 % (ref 39.0–52.0)
Hemoglobin: 13.2 g/dL (ref 13.0–17.0)
MCH: 30.2 pg (ref 26.0–34.0)
MCHC: 33.7 g/dL (ref 30.0–36.0)
MCV: 89.7 fL (ref 80.0–100.0)
Platelets: 153 10*3/uL (ref 150–400)
RBC: 4.37 MIL/uL (ref 4.22–5.81)
RDW: 12.8 % (ref 11.5–15.5)
WBC: 11 10*3/uL — ABNORMAL HIGH (ref 4.0–10.5)
nRBC: 0 % (ref 0.0–0.2)

## 2020-12-27 LAB — PHOSPHORUS: Phosphorus: 2.7 mg/dL (ref 2.5–4.6)

## 2020-12-27 LAB — MAGNESIUM: Magnesium: 2 mg/dL (ref 1.7–2.4)

## 2020-12-27 LAB — RESP PANEL BY RT-PCR (FLU A&B, COVID) ARPGX2
Influenza A by PCR: NEGATIVE
Influenza B by PCR: NEGATIVE
SARS Coronavirus 2 by RT PCR: NEGATIVE

## 2020-12-27 MED ORDER — IBUPROFEN 200 MG PO TABS: 600.0000 mg | ORAL_TABLET | Freq: Four times a day (QID) | ORAL | Status: DC | PRN

## 2020-12-27 MED ORDER — POLYETHYLENE GLYCOL 3350 17 G PO PACK: 17.0000 g | PACK | Freq: Every day | ORAL | Status: DC | PRN

## 2020-12-27 MED ORDER — FOLIC ACID 1 MG PO TABS
1.0000 mg | ORAL_TABLET | Freq: Every day | ORAL | Status: DC
  Administered 2020-12-27 – 2020-12-29 (×2): 1 mg via ORAL
  Filled 2020-12-27 (×2): qty 1

## 2020-12-27 MED ORDER — SODIUM CHLORIDE 0.9 % IV BOLUS
500.0000 mL | Freq: Once | INTRAVENOUS | Status: AC
  Administered 2020-12-27: 500 mL via INTRAVENOUS

## 2020-12-27 MED ORDER — ENOXAPARIN SODIUM 40 MG/0.4ML IJ SOSY
40.0000 mg | PREFILLED_SYRINGE | INTRAMUSCULAR | Status: DC
  Administered 2020-12-28: 40 mg via SUBCUTANEOUS
  Filled 2020-12-27: qty 0.4

## 2020-12-27 MED ORDER — IOHEXOL 350 MG/ML SOLN
100.0000 mL | Freq: Once | INTRAVENOUS | Status: AC | PRN
  Administered 2020-12-27: 80 mL via INTRAVENOUS

## 2020-12-27 MED ORDER — METOPROLOL TARTRATE 5 MG/5ML IV SOLN: 5.0000 mg | Freq: Four times a day (QID) | INTRAVENOUS | Status: DC | PRN

## 2020-12-27 MED ORDER — PIPERACILLIN-TAZOBACTAM 3.375 G IVPB
3.3750 g | Freq: Three times a day (TID) | INTRAVENOUS | Status: DC
  Administered 2020-12-27 – 2020-12-29 (×5): 3.375 g via INTRAVENOUS
  Filled 2020-12-27 (×5): qty 50

## 2020-12-27 MED ORDER — KETOROLAC TROMETHAMINE 15 MG/ML IJ SOLN
15.0000 mg | Freq: Four times a day (QID) | INTRAMUSCULAR | Status: DC
  Administered 2020-12-27 – 2020-12-29 (×6): 15 mg via INTRAVENOUS
  Filled 2020-12-27 (×6): qty 1

## 2020-12-27 MED ORDER — OXYCODONE HCL 5 MG PO TABS
10.0000 mg | ORAL_TABLET | ORAL | Status: DC | PRN
  Administered 2020-12-27 – 2020-12-28 (×3): 10 mg via ORAL
  Filled 2020-12-27: qty 2
  Filled 2020-12-27: qty 3
  Filled 2020-12-27: qty 2

## 2020-12-27 MED ORDER — ONDANSETRON HCL 4 MG/2ML IJ SOLN: 4.0000 mg | Freq: Four times a day (QID) | INTRAMUSCULAR | Status: DC | PRN

## 2020-12-27 MED ORDER — LORAZEPAM 2 MG/ML IJ SOLN: 1.0000 mg | INTRAMUSCULAR | Status: DC | PRN

## 2020-12-27 MED ORDER — PIPERACILLIN-TAZOBACTAM 3.375 G IVPB 30 MIN
3.3750 g | Freq: Once | INTRAVENOUS | Status: AC
  Administered 2020-12-27: 3.375 g via INTRAVENOUS
  Filled 2020-12-27: qty 50

## 2020-12-27 MED ORDER — NICOTINE 21 MG/24HR TD PT24
21.0000 mg | MEDICATED_PATCH | Freq: Once | TRANSDERMAL | Status: AC
  Administered 2020-12-27: 21 mg via TRANSDERMAL
  Filled 2020-12-27: qty 1

## 2020-12-27 MED ORDER — LORAZEPAM 1 MG PO TABS: 1.0000 mg | ORAL_TABLET | ORAL | Status: DC | PRN

## 2020-12-27 MED ORDER — MORPHINE SULFATE (PF) 2 MG/ML IV SOLN
2.0000 mg | Freq: Once | INTRAVENOUS | Status: AC
  Administered 2020-12-27: 2 mg via INTRAVENOUS
  Filled 2020-12-27: qty 1

## 2020-12-27 MED ORDER — OXYCODONE HCL 5 MG PO TABS
5.0000 mg | ORAL_TABLET | ORAL | Status: DC | PRN
Start: 2020-12-27 — End: 2020-12-27
  Administered 2020-12-27: 10 mg via ORAL
  Filled 2020-12-27: qty 2

## 2020-12-27 MED ORDER — MORPHINE SULFATE (PF) 2 MG/ML IV SOLN
2.0000 mg | INTRAVENOUS | Status: DC | PRN
  Administered 2020-12-28: 2 mg via INTRAVENOUS
  Filled 2020-12-27: qty 1

## 2020-12-27 MED ORDER — SODIUM CHLORIDE 0.9 % IV BOLUS
1000.0000 mL | Freq: Once | INTRAVENOUS | Status: AC
  Administered 2020-12-27: 1000 mL via INTRAVENOUS

## 2020-12-27 MED ORDER — ADULT MULTIVITAMIN W/MINERALS CH
1.0000 | ORAL_TABLET | Freq: Every day | ORAL | Status: DC
  Administered 2020-12-27 – 2020-12-29 (×2): 1 via ORAL
  Filled 2020-12-27 (×2): qty 1

## 2020-12-27 MED ORDER — ACETAMINOPHEN 325 MG PO TABS: 650.0000 mg | ORAL_TABLET | Freq: Four times a day (QID) | ORAL | Status: DC | PRN

## 2020-12-27 MED ORDER — ACETAMINOPHEN 650 MG RE SUPP: 650.0000 mg | Freq: Four times a day (QID) | RECTAL | Status: DC | PRN

## 2020-12-27 MED ORDER — MORPHINE SULFATE (PF) 4 MG/ML IV SOLN
4.0000 mg | Freq: Once | INTRAVENOUS | Status: AC
  Administered 2020-12-27: 4 mg via INTRAVENOUS
  Filled 2020-12-27: qty 1

## 2020-12-27 MED ORDER — DOCUSATE SODIUM 100 MG PO CAPS
100.0000 mg | ORAL_CAPSULE | Freq: Two times a day (BID) | ORAL | Status: DC
  Administered 2020-12-27 – 2020-12-29 (×3): 100 mg via ORAL
  Filled 2020-12-27 (×4): qty 1

## 2020-12-27 MED ORDER — SODIUM CHLORIDE (PF) 0.9 % IJ SOLN
INTRAMUSCULAR | Status: AC
  Filled 2020-12-27: qty 50

## 2020-12-27 MED ORDER — SODIUM CHLORIDE 0.9 % IV SOLN: INTRAVENOUS | Status: DC

## 2020-12-27 MED ORDER — ONDANSETRON 4 MG PO TBDP: 4.0000 mg | ORAL_TABLET | Freq: Four times a day (QID) | ORAL | Status: DC | PRN

## 2020-12-27 MED ORDER — THIAMINE HCL 100 MG PO TABS
100.0000 mg | ORAL_TABLET | Freq: Every day | ORAL | Status: DC
  Administered 2020-12-29: 100 mg via ORAL
  Filled 2020-12-27: qty 1

## 2020-12-27 MED ORDER — THIAMINE HCL 100 MG/ML IJ SOLN
100.0000 mg | Freq: Every day | INTRAMUSCULAR | Status: DC
  Administered 2020-12-27: 100 mg via INTRAVENOUS
  Filled 2020-12-27: qty 2

## 2020-12-27 MED ORDER — METHOCARBAMOL 1000 MG/10ML IJ SOLN
1000.0000 mg | Freq: Three times a day (TID) | INTRAVENOUS | Status: DC
  Administered 2020-12-27 – 2020-12-29 (×5): 1000 mg via INTRAVENOUS
  Filled 2020-12-27 (×2): qty 10
  Filled 2020-12-27: qty 1000
  Filled 2020-12-27: qty 10
  Filled 2020-12-27 (×3): qty 1000

## 2020-12-27 NOTE — ED Notes (Signed)
Patient was given a dinner tray. 

## 2020-12-27 NOTE — ED Notes (Signed)
ED TO INPATIENT HANDOFF REPORT  Name/Age/Gender Joel Murphy 35 y.o. male  Code Status    Code Status Orders  (From admission, onward)         Start     Ordered   12/27/20 1543  Full code  Continuous        12/27/20 1547        Code Status History    This patient has a current code status but no historical code status.      Home/SNF/Other Home  Chief Complaint Perirectal abscess [K61.1]  Level of Care/Admitting Diagnosis ED Disposition    ED Disposition  Admit   Condition  --   Comment  Hospital Area: [facility] [[id number]]  Level of Care: Med-Surg [16]  May place patient in observation at [facility] or [facility] if equivalent level of care is available:: No  Covid Evaluation: Asymptomatic Screening Protocol (No Symptoms)  Diagnosis: Perirectal abscess [798921]  Admitting Physician: CCS, MD [[id number]]  Attending Physician: CCS, MD [[id number]]  Bed request comments: 3E         Medical History History reviewed. No pertinent past medical history.  Allergies No Known Allergies  IV Location/Drains/Wounds Patient Lines/Drains/Airways Status    Active Line/Drains/Airways    Name Placement date Placement time Site Days   Peripheral IV 12/27/20 20 G Anterior;Distal;Right;Upper Arm 12/27/20  1301  Arm  less than 1          Labs/Imaging Results for orders placed or performed during the hospital encounter of 12/27/20 (from the past 48 hour(s))  CBC with Differential     Status: Abnormal   Collection Time: 12/27/20 12:50 PM  Result Value Ref Range   WBC 12.1 (H) 4.0 - 10.5 K/uL   RBC 4.74 4.22 - 5.81 MIL/uL   Hemoglobin 14.3 13.0 - 17.0 g/dL   HCT 19.4 17.4 - 08.1 %   MCV 89.9 80.0 - 100.0 fL   MCH 30.2 26.0 - 34.0 pg   MCHC 33.6 30.0 - 36.0 g/dL   RDW 44.8 18.5 - 63.1 %   Platelets 155 150 - 400 K/uL   nRBC 0.0 0.0 - 0.2 %   Neutrophils Relative % 80 %   Neutro Abs 9.6 (H) 1.7 - 7.7 K/uL   Lymphocytes Relative 13 %   Lymphs Abs 1.6  0.7 - 4.0 K/uL   Monocytes Relative 7 %   Monocytes Absolute 0.8 0.1 - 1.0 K/uL   Eosinophils Relative 0 %   Eosinophils Absolute 0.1 0.0 - 0.5 K/uL   Basophils Relative 0 %   Basophils Absolute 0.0 0.0 - 0.1 K/uL   Immature Granulocytes 0 %   Abs Immature Granulocytes 0.05 0.00 - 0.07 K/uL    Comment: Performed at [facility], [address], [place], [place] [address]  Basic metabolic panel     Status: Abnormal   Collection Time: 12/27/20 12:50 PM  Result Value Ref Range   Sodium 138 135 - 145 mmol/L   Potassium 4.4 3.5 - 5.1 mmol/L   Chloride 105 98 - 111 mmol/L   CO2 28 22 - 32 mmol/L   Glucose, Bld 108 (H) 70 - 99 mg/dL    Comment: Glucose reference range applies only to samples taken after fasting for at least 8 hours.   BUN 11 6 - 20 mg/dL   Creatinine, Ser 6.37 0.61 - 1.24 mg/dL   Calcium 9.3 8.9 - 85.8 mg/dL   GFR, Estimated >85 >02 mL/min  Comment: (NOTE) Calculated using the CKD-EPI Creatinine Equation (2021)    Anion gap 5 5 - 15    Comment: Performed at [facility], [address] [place], [place] [address]  Resp Panel by RT-PCR (Flu A&B, Covid) Nasopharyngeal Swab     Status: None   Collection Time: 12/27/20  4:00 PM   Specimen: Nasopharyngeal Swab; Nasopharyngeal(NP) swabs in vial transport medium  Result Value Ref Range   SARS Coronavirus 2 by RT PCR NEGATIVE NEGATIVE    Comment: (NOTE) SARS-CoV-2 target nucleic acids are NOT DETECTED.  The SARS-CoV-2 RNA is generally detectable in upper respiratory specimens during the acute phase of infection. The lowest concentration of SARS-CoV-2 viral copies this assay can detect is 138 copies/mL. A negative result does not preclude SARS-Cov-2 infection and should not be used as the sole basis for treatment or other patient management decisions. A negative result may occur with  improper specimen collection/handling, submission of specimen other than nasopharyngeal swab,  presence of viral mutation(s) within the areas targeted by this assay, and inadequate number of viral copies(<138 copies/mL). A negative result must be combined with clinical observations, patient history, and epidemiological information. The expected result is Negative.  Fact Sheet for Patients:  BloggerCourse.com  Fact Sheet for Healthcare Providers:  SeriousBroker.it  This test is no t yet approved or cleared by the [place] FDA and  has been authorized for detection and/or diagnosis of SARS-CoV-2 by FDA under an Emergency Use Authorization (EUA). This EUA will remain  in effect (meaning this test can be used) for the duration of the COVID-19 declaration under Section 564(b)(1) of the Act, 21 U.S.C.section 360bbb-3(b)(1), unless the authorization is terminated  or revoked sooner.       Influenza A by PCR NEGATIVE NEGATIVE   Influenza B by PCR NEGATIVE NEGATIVE    Comment: (NOTE) The Xpert Xpress SARS-CoV-2/FLU/RSV plus assay is intended as an aid in the diagnosis of influenza from Nasopharyngeal swab specimens and should not be used as a sole basis for treatment. Nasal washings and aspirates are unacceptable for Xpert Xpress SARS-CoV-2/FLU/RSV testing.  Fact Sheet for Patients: BloggerCourse.com  Fact Sheet for Healthcare Providers: SeriousBroker.it  This test is not yet approved or cleared by the [place] FDA and has been authorized for detection and/or diagnosis of SARS-CoV-2 by FDA under an Emergency Use Authorization (EUA). This EUA will remain in effect (meaning this test can be used) for the duration of the COVID-19 declaration under Section 564(b)(1) of the Act, 21 U.S.C. section 360bbb-3(b)(1), unless the authorization is terminated or revoked.  Performed at [facility], [address], [place], [place] [address]   Comprehensive  metabolic panel     Status: Abnormal   Collection Time: 12/27/20  4:05 PM  Result Value Ref Range   Sodium 136 135 - 145 mmol/L   Potassium 3.7 3.5 - 5.1 mmol/L   Chloride 105 98 - 111 mmol/L   CO2 26 22 - 32 mmol/L   Glucose, Bld 148 (H) 70 - 99 mg/dL    Comment: Glucose reference range applies only to samples taken after fasting for at least 8 hours.   BUN 9 6 - 20 mg/dL   Creatinine, Ser 0.98 0.61 - 1.24 mg/dL   Calcium 8.8 (L) 8.9 - 10.3 mg/dL   Total Protein 7.1 6.5 - 8.1 g/dL   Albumin 3.3 (L) 3.5 - 5.0 g/dL   AST 29 15 - 41 U/L   ALT 44 0 -  44 U/L   Alkaline Phosphatase 53 38 - 126 U/L   Total Bilirubin 0.4 0.3 - 1.2 mg/dL   GFR, Estimated >78>60 >29>60 mL/min    Comment: (NOTE) Calculated using the CKD-EPI Creatinine Equation (2021)    Anion gap 5 5 - 15    Comment: Performed at [facility], [address] [place], [place] [address]  Magnesium     Status: None   Collection Time: 12/27/20  4:05 PM  Result Value Ref Range   Magnesium 2.0 1.7 - 2.4 mg/dL    Comment: Performed at [facility], [address] [place], [place] [address]  Phosphorus     Status: None   Collection Time: 12/27/20  4:05 PM  Result Value Ref Range   Phosphorus 2.7 2.5 - 4.6 mg/dL    Comment: Performed at [facility], [address] [place], [place] [address]  CBC     Status: Abnormal   Collection Time: 12/27/20  4:05 PM  Result Value Ref Range   WBC 11.0 (H) 4.0 - 10.5 K/uL   RBC 4.37 4.22 - 5.81 MIL/uL   Hemoglobin 13.2 13.0 - 17.0 g/dL   HCT 29.539.2 28.439.0 - 13.252.0 %   MCV 89.7 80.0 - 100.0 fL   MCH 30.2 26.0 - 34.0 pg   MCHC 33.7 30.0 - 36.0 g/dL   RDW 44.012.8 10.211.5 - 72.515.5 %   Platelets 153 150 - 400 K/uL   nRBC 0.0 0.0 - 0.2 %    Comment: Performed at [facility], [address] [place], [place] [address]   CT PELVIS W CONTRAST  Result Date: 12/27/2020 CLINICAL DATA:  Suspicion for rectal abscess. EXAM: CT PELVIS WITH  CONTRAST TECHNIQUE: Multidetector CT imaging of the pelvis was performed using the standard protocol following the bolus administration of intravenous contrast. CONTRAST:  80mL OMNIPAQUE IOHEXOL 350 MG/ML SOLN COMPARISON:  None FINDINGS: Urinary Tract: No distal ureteral dilation. Urinary bladder with smooth contours. Bowel: Perianal stranding extending into the LEFT gluteal cleft. Low attenuation in the LEFT perianal region. Potential ill-defined, developing fluid collection in this location centrally but without well-defined collection at this time. In total stranding in the axial plane measuring approximately 3.8 x 1.5 cm. More pronounced low attenuation suggested centrally on the coronal images approximately 2.8 x 0.6 cm. No gas in the soft tissues. Visualized gastrointestinal tract is otherwise unremarkable No discrete tract extending from the sphincter complex or anus to account for these findings. Mild stranding in the medial upper thigh in the subcutaneous fight bilaterally. Vascular/Lymphatic: Mild enlargement of bilateral pelvic lymph nodes LEFT greater than RIGHT. Most retained fatty hila. Patent pelvic vasculature with normal caliber. Reproductive:  Unremarkable. Other:  No pelvic fluid collection. On coronal images suggestion of stranding in the gluteal cleft that extends towards the base of the scrotum. Musculoskeletal: No acute bone finding. IMPRESSION: Perianal inflammation extending into the gluteal cleft and towards the base of the scrotum without defined fluid collection or soft tissue gas. Correlate with any signs of perianal fistula or other risk factors for perianal infection such as inflammatory bowel disease. Area best described at this time as a phlegmon in the absence of discrete collection or soft tissue gas. Central portion is denser raising the question of developing fluid collection in this area. Stranding extends into the upper thigh particularly on the LEFT. Mild enlargement of  bilateral pelvic lymph nodes LEFT greater than RIGHT, likely reactive. Attention on follow-up Electronically Signed  By: Donzetta Kohut M.D.   On: 12/27/2020 15:11    Pending Labs Unresulted Labs (From admission, onward)    Start     Ordered   01/03/21 0500  Creatinine, serum  (enoxaparin (LOVENOX)    CrCl >/= 30 ml/min)  Weekly,   R     Comments: while on enoxaparin therapy    12/27/20 1547   12/28/20 0500  Basic metabolic panel  Tomorrow morning,   R        12/27/20 1547   12/28/20 0500  CBC  Tomorrow morning,   R        12/27/20 1547   12/27/20 1543  HIV Antibody (routine testing w rflx)  (HIV Antibody (Routine testing w reflex) panel)  Once,   STAT        12/27/20 1547          Vitals/Pain Today's Vitals   12/27/20 1435 12/27/20 1628 12/27/20 1805 12/27/20 1930  BP: (!) 120/95  137/73 135/84  Pulse: 72  67 74  Resp: 14  18 18   Temp:      TempSrc:      SpO2: 100%  97% 99%  PainSc:  7       Isolation Precautions No active isolations  Medications Medications  nicotine (NICODERM CQ - dosed in mg/24 hours) patch 21 mg (21 mg Transdermal Patch Applied 12/27/20 1341)  enoxaparin (LOVENOX) injection 40 mg (0 mg Subcutaneous Hold 12/27/20 1606)  0.9 %  sodium chloride infusion ( Intravenous New Bag/Given 12/27/20 1627)  acetaminophen (TYLENOL) tablet 650 mg (has no administration in time range)    Or  acetaminophen (TYLENOL) suppository 650 mg (has no administration in time range)  morphine 2 MG/ML injection 2 mg (has no administration in time range)  docusate sodium (COLACE) capsule 100 mg (has no administration in time range)  polyethylene glycol (MIRALAX / GLYCOLAX) packet 17 g (has no administration in time range)  ondansetron (ZOFRAN-ODT) disintegrating tablet 4 mg (has no administration in time range)    Or  ondansetron (ZOFRAN) injection 4 mg (has no administration in time range)  metoprolol tartrate (LOPRESSOR) injection 5 mg (has no administration in time range)   piperacillin-tazobactam (ZOSYN) IVPB 3.375 g (has no administration in time range)  LORazepam (ATIVAN) tablet 1-4 mg (has no administration in time range)    Or  LORazepam (ATIVAN) injection 1-4 mg (has no administration in time range)  thiamine tablet 100 mg ( Oral See Alternative 12/27/20 1627)    Or  thiamine (B-1) injection 100 mg (100 mg Intravenous Given 12/27/20 1627)  folic acid (FOLVITE) tablet 1 mg (1 mg Oral Given 12/27/20 1627)  multivitamin with minerals tablet 1 tablet (1 tablet Oral Given 12/27/20 1627)  methocarbamol (ROBAXIN) 1,000 mg in dextrose 5 % 100 mL IVPB (0 mg Intravenous Stopped 12/27/20 1743)  oxyCODONE (Oxy IR/ROXICODONE) immediate release tablet 10-15 mg (has no administration in time range)  ketorolac (TORADOL) 15 MG/ML injection 15 mg (15 mg Intravenous Given 12/27/20 1718)  sodium chloride 0.9 % bolus 1,000 mL (0 mLs Intravenous Stopped 12/27/20 1553)  morphine 2 MG/ML injection 2 mg (2 mg Intravenous Given 12/27/20 1301)  iohexol (OMNIPAQUE) 350 MG/ML injection 100 mL (80 mLs Intravenous Contrast Given 12/27/20 1424)  sodium chloride (PF) 0.9 % injection (  Given 12/27/20 1435)  morphine 4 MG/ML injection 4 mg (4 mg Intravenous Given 12/27/20 1553)  sodium chloride 0.9 % bolus 500 mL (0 mLs Intravenous Stopped 12/27/20 1713)  piperacillin-tazobactam (ZOSYN) IVPB  3.375 g (0 g Intravenous Stopped 12/27/20 1630)    Mobility manual wheelchair

## 2020-12-27 NOTE — Plan of Care (Signed)
  Problem: Clinical Measurements: Goal: Will remain free from infection Outcome: Progressing   Problem: Activity: Goal: Risk for activity intolerance will decrease Outcome: Progressing   Problem: Coping: Goal: Level of anxiety will decrease Outcome: Progressing   Problem: Elimination: Goal: Will not experience complications related to bowel motility Outcome: Progressing   Problem: Pain Managment: Goal: General experience of comfort will improve Outcome: Progressing   

## 2020-12-27 NOTE — ED Triage Notes (Signed)
Patient c/o rectal abscess x2 days.

## 2020-12-27 NOTE — H&P (Signed)
Admission Note  Joel Murphy [birth year]  [id number].    Requesting MD: Harlene Salts, PA-C Chief Complaint/Reason for Consult: Perirectal abscess  HPI:  Patient is a 35 year old male with no other reported PMH who presented to [facility] today with concern for perirectal abscess. He reports perirectal pain started Friday 12/24/20, 3 days ago and is sharp and severe, constant. Pain is worsened with sitting and nothing has alleviated. He reports  pain with defecation, no BM x 3 days.  Pain voiding and has to sit to void.  . Denied fever, chills, chest pain, SOB, hematochezia or melena. NKDA. He has a history of heavy ETOH use, and quit last week.  He has been on opioids on and off since 2007.  He had go off but has had a relapse and stopped last week also.  He is getting suboxone on the street and is taking 8 mg BID.He also smokes and is trying to quit.  He had a court date today and wanted to complete that before coming for assistance.  He reports a buttocks abscess 15 years ago, not sure why.  Resolved on it's own.    ROS: Review of Systems  Constitutional:  Negative for chills and fever.  HENT: Negative.    Eyes: Negative.   Respiratory: Negative.  Negative for shortness of breath and wheezing.   Cardiovascular: Negative.  Negative for chest pain and palpitations.  Gastrointestinal:  Positive for constipation (since onset of pain 12/24/20) and heartburn (occasional, none recently). Negative for abdominal pain, blood in stool, diarrhea, melena, nausea and vomiting.       Rectal pain   Genitourinary:  Negative for dysuria, frequency and urgency.       Cannot void standing, has to sit to void over last 2 days.  Musculoskeletal: Negative.   Skin:        Red and tender left buttocks  Neurological: Negative.   Endo/Heme/Allergies: Negative.   Psychiatric/Behavioral:  Positive for substance abuse.    No family history on file.  History reviewed. No pertinent past medical history.  History  reviewed. No pertinent surgical history.  Social History:  reports that he has been smoking cigarettes. He has a 2.50 pack-year smoking history. He has quit using smokeless tobacco.  His smokeless tobacco use included chew. He reports that he does not drink alcohol and does not use drugs.  Allergies: No Known Allergies    Blood pressure (!) 120/95, pulse 72, temperature 98.4 F (36.9 C), temperature source Oral, resp. rate 14, SpO2 100 %. Physical Exam:  General: pleasant, WD, WN malewho is laying in bed in on his side, with significant pain.  No relief with 2 mg of Morphine, getting more now from the ED physician. HEENT: head is normocephalic, atraumatic.  Sclera are noninjected.  Pupils are equal.  Ears and nose without any masses or lesions.  Mouth is pink and moist Heart: regular, rate, and rhythm.  Normal s1,s2. No obvious murmurs, gallops, or rubs noted.  Palpable radial and pedal pulses bilaterally Lungs: CTAB, no wheezes, rhonchi, or rales noted.  Respiratory effort nonlabored Abd: soft, NT, ND, +BS, no masses, hernias, or organomegaly Rectal:  rectal not done due to pain.  Right buttocks is normal.  Left buttocks mid gluteal cleft with 4 x 3 cm area of swelling and erythema.   GU: he is tender left groin, ? Lymph node.   MS: all 4 extremities are symmetrical with no cyanosis, clubbing, or edema. Skin:  warm and dry with no masses, lesions, or rashes Neuro: Cranial nerves 2-12 grossly intact, sensation is normal throughout Psych: A&Ox3 with an appropriate affect.  He is very honest about his substance use and is trying to wean himself off.     Results for orders placed or performed during the hospital encounter of 12/27/20 (from the past 48 hour(s))  CBC with Differential     Status: Abnormal   Collection Time: 12/27/20 12:50 PM  Result Value Ref Range   WBC 12.1 (H) 4.0 - 10.5 K/uL   RBC 4.74 4.22 - 5.81 MIL/uL   Hemoglobin 14.3 13.0 - 17.0 g/dL   HCT 03.5 00.9 - 38.1 %   MCV  89.9 80.0 - 100.0 fL   MCH 30.2 26.0 - 34.0 pg   MCHC 33.6 30.0 - 36.0 g/dL   RDW 82.9 93.7 - 16.9 %   Platelets 155 150 - 400 K/uL   nRBC 0.0 0.0 - 0.2 %   Neutrophils Relative % 80 %   Neutro Abs 9.6 (H) 1.7 - 7.7 K/uL   Lymphocytes Relative 13 %   Lymphs Abs 1.6 0.7 - 4.0 K/uL   Monocytes Relative 7 %   Monocytes Absolute 0.8 0.1 - 1.0 K/uL   Eosinophils Relative 0 %   Eosinophils Absolute 0.1 0.0 - 0.5 K/uL   Basophils Relative 0 %   Basophils Absolute 0.0 0.0 - 0.1 K/uL   Immature Granulocytes 0 %   Abs Immature Granulocytes 0.05 0.00 - 0.07 K/uL    Comment: Performed at [facility], [address], [place], [place] [address]  Basic metabolic panel     Status: Abnormal   Collection Time: 12/27/20 12:50 PM  Result Value Ref Range   Sodium 138 135 - 145 mmol/L   Potassium 4.4 3.5 - 5.1 mmol/L   Chloride 105 98 - 111 mmol/L   CO2 28 22 - 32 mmol/L   Glucose, Bld 108 (H) 70 - 99 mg/dL    Comment: Glucose reference range applies only to samples taken after fasting for at least 8 hours.   BUN 11 6 - 20 mg/dL   Creatinine, Ser 8.10 0.61 - 1.24 mg/dL   Calcium 9.3 8.9 - 17.5 mg/dL   GFR, Estimated >10 >25 mL/min    Comment: (NOTE) Calculated using the CKD-EPI Creatinine Equation (2021)    Anion gap 5 5 - 15    Comment: Performed at [facility], [address] [place], [place] [address]   CT PELVIS W CONTRAST  Result Date: 12/27/2020 CLINICAL DATA:  Suspicion for rectal abscess. EXAM: CT PELVIS WITH CONTRAST TECHNIQUE: Multidetector CT imaging of the pelvis was performed using the standard protocol following the bolus administration of intravenous contrast. CONTRAST:  24mL OMNIPAQUE IOHEXOL 350 MG/ML SOLN COMPARISON:  None FINDINGS: Urinary Tract: No distal ureteral dilation. Urinary bladder with smooth contours. Bowel: Perianal stranding extending into the LEFT gluteal cleft. Low attenuation in the LEFT perianal region. Potential  ill-defined, developing fluid collection in this location centrally but without well-defined collection at this time. In total stranding in the axial plane measuring approximately 3.8 x 1.5 cm. More pronounced low attenuation suggested centrally on the coronal images approximately 2.8 x 0.6 cm. No gas in the soft tissues. Visualized gastrointestinal tract is otherwise unremarkable No discrete tract extending from the sphincter complex or anus to account for these findings. Mild stranding in the medial upper thigh in the subcutaneous fight bilaterally. Vascular/Lymphatic: Mild enlargement of bilateral pelvic  lymph nodes LEFT greater than RIGHT. Most retained fatty hila. Patent pelvic vasculature with normal caliber. Reproductive:  Unremarkable. Other:  No pelvic fluid collection. On coronal images suggestion of stranding in the gluteal cleft that extends towards the base of the scrotum. Musculoskeletal: No acute bone finding. IMPRESSION: Perianal inflammation extending into the gluteal cleft and towards the base of the scrotum without defined fluid collection or soft tissue gas. Correlate with any signs of perianal fistula or other risk factors for perianal infection such as inflammatory bowel disease. Area best described at this time as a phlegmon in the absence of discrete collection or soft tissue gas. Central portion is denser raising the question of developing fluid collection in this area. Stranding extends into the upper thigh particularly on the LEFT. Mild enlargement of bilateral pelvic lymph nodes LEFT greater than RIGHT, likely reactive. Attention on follow-up Electronically Signed   By: Donzetta Kohut M.D.   On: 12/27/2020 15:11      Assessment/Plan Perirectal abscess - CT today with perianal inflammation from gluteal cleft to base of the scrotum, question possible developing fluid collection in central portion of phlegmonous area - WBC 12, afeb - recommend admission to observation, IV abx, likely  plan for EUA with I&D in OR tomorrow AM  FEN: regular diet/NPO after MN VTE:SCD's ID: [email or web address]  Substance use - fentanyl, on suboxone(OTC), ETOH use, tobacco use  Will Marlyne Beards [facility] 12/27/2020, 3:27 PM Please see Amion for pager number during day hours 7:00am-4:30pm

## 2020-12-27 NOTE — ED Provider Notes (Signed)
[facility]-EMERGENCY DEPT Provider Note   CSN: [id number] Arrival date & time: 12/27/20  1117     History Chief Complaint  Patient presents with   Abscess    Joel Murphy is a 35 y.o. male otherwise healthy male presents today for concern of perirectal abscess.  Patient reports onset of pain was 2 days ago, sharp severe constant worsened with sitting no alleviating factors no radiation of pain.  Patient reports occasionally he will have pain with bowel movement.  Denies fever/chills, hematochezia, melena, abdominal pain/swelling or any additional concerns.  HPI     History reviewed. No pertinent past medical history.  There are no problems to display for this patient.   History reviewed. No pertinent surgical history.     No family history on file.  Social History   Tobacco Use   Smoking status: Every Day    Packs/day: 0.25    Years: 10.00    Pack years: 2.50    Types: Cigarettes   Smokeless tobacco: Former    Types: Chew  Substance Use Topics   Alcohol use: No   Drug use: No    Home Medications Prior to Admission medications   Medication Sig Start Date End Date Taking? Authorizing Provider  HYDROcodone-acetaminophen (NORCO/VICODIN) 5-325 MG tablet Take 1 tablet by mouth every 4 (four) hours as needed. 08/26/16   Tegeler, Canary Brim, MD    Allergies    Patient has no known allergies.  Review of Systems   Review of Systems Ten systems are reviewed and are negative for acute change except as noted in the HPI  Physical Exam Updated Vital Signs BP (!) 120/95   Pulse 72   Temp 98.4 F (36.9 C) (Oral)   Resp 14   SpO2 100%   Physical Exam Genitourinary:      Comments: Rectal examination chaperoned by DJ nurse tech.  Mild erythema at the gluteal cleft, extending towards the rectum, small mount of fluctuance palpable.     ED Results / Procedures / Treatments   Labs (all labs ordered are listed, but only abnormal results are  displayed) Labs Reviewed  CBC WITH DIFFERENTIAL/PLATELET - Abnormal; Notable for the following components:      Result Value   WBC 12.1 (*)    Neutro Abs 9.6 (*)    All other components within normal limits  BASIC METABOLIC PANEL - Abnormal; Notable for the following components:   Glucose, Bld 108 (*)    All other components within normal limits  RESP PANEL BY RT-PCR (FLU A&B, COVID) [id number]    EKG None  Radiology CT PELVIS W CONTRAST  Result Date: 12/27/2020 CLINICAL DATA:  Suspicion for rectal abscess. EXAM: CT PELVIS WITH CONTRAST TECHNIQUE: Multidetector CT imaging of the pelvis was performed using the standard protocol following the bolus administration of intravenous contrast. CONTRAST:  73mL OMNIPAQUE IOHEXOL 350 MG/ML SOLN COMPARISON:  None FINDINGS: Urinary Tract: No distal ureteral dilation. Urinary bladder with smooth contours. Bowel: Perianal stranding extending into the LEFT gluteal cleft. Low attenuation in the LEFT perianal region. Potential ill-defined, developing fluid collection in this location centrally but without well-defined collection at this time. In total stranding in the axial plane measuring approximately 3.8 x 1.5 cm. More pronounced low attenuation suggested centrally on the coronal images approximately 2.8 x 0.6 cm. No gas in the soft tissues. Visualized gastrointestinal tract is otherwise unremarkable No discrete tract extending from the sphincter complex or anus to account for these findings. Mild stranding in  the medial upper thigh in the subcutaneous fight bilaterally. Vascular/Lymphatic: Mild enlargement of bilateral pelvic lymph nodes LEFT greater than RIGHT. Most retained fatty hila. Patent pelvic vasculature with normal caliber. Reproductive:  Unremarkable. Other:  No pelvic fluid collection. On coronal images suggestion of stranding in the gluteal cleft that extends towards the base of the scrotum. Musculoskeletal: No acute bone finding. IMPRESSION: Perianal  inflammation extending into the gluteal cleft and towards the base of the scrotum without defined fluid collection or soft tissue gas. Correlate with any signs of perianal fistula or other risk factors for perianal infection such as inflammatory bowel disease. Area best described at this time as a phlegmon in the absence of discrete collection or soft tissue gas. Central portion is denser raising the question of developing fluid collection in this area. Stranding extends into the upper thigh particularly on the LEFT. Mild enlargement of bilateral pelvic lymph nodes LEFT greater than RIGHT, likely reactive. Attention on follow-up Electronically Signed   By: Donzetta Kohut M.D.   On: 12/27/2020 15:11    Procedures Procedures   =========================================== EMERGENCY DEPARTMENT US SOFT TISSUE INTERPRETATION "Study: Limited Soft Tissue Ultrasound"  INDICATIONS: Soft tissue infection Multiple views of the body part were obtained in real-time with a multi-frequency linear probe.  Ultrasound chaperoned by DJ nurse tech.  PERFORMED BY: Myself IMAGES ARCHIVED?: Yes SIDE:Left BODY PART: left buttock INTERPRETATION:  Cellulitis present with questionable abscess extending down towards the rectum.  Medications Ordered in ED Medications  nicotine (NICODERM CQ - dosed in mg/24 hours) patch 21 mg (21 mg Transdermal Patch Applied 12/27/20 1341)  morphine 4 MG/ML injection 4 mg (has no administration in time range)  sodium chloride 0.9 % bolus 500 mL (has no administration in time range)  sodium chloride 0.9 % bolus 1,000 mL (1,000 mLs Intravenous New Bag/Given 12/27/20 1301)  morphine 2 MG/ML injection 2 mg (2 mg Intravenous Given 12/27/20 1301)  iohexol (OMNIPAQUE) 350 MG/ML injection 100 mL (80 mLs Intravenous Contrast Given 12/27/20 1424)  sodium chloride (PF) 0.9 % injection (  Given 12/27/20 1435)    ED Course  I have reviewed the triage vital signs and the nursing notes.  Pertinent labs  & imaging results that were available during my care of the patient were reviewed by me and considered in my medical decision making (see chart for details).    MDM Rules/Calculators/A&P                         Additional history obtained from: Nursing notes from this visit. Patient's wife at bedside. ----------------- 50 year old otherwise healthy male presented for left gluteal pain onset 2 days ago, patient concern for perirectal abscess, endorses some pain with bowel movements.  No palpable fluctuance on physical exam, ultrasound was then used with questionable abscess which appears to be extending down towards the rectum on ultrasound.  Will obtain CT for further evaluation, risk versus benefits of CT imaging were discussed with patient he states understanding and wishes to proceed.  Physical examination was chaperoned by DJ nurse tech. -------- I ordered, reviewed and interpreted labs which include: CBC shows leukocytosis of 12.1, no anemia or thrombocytopenia. BMP shows no emergent Electra derangement, AKI or gap.  CT Pelvis:  IMPRESSION:  Perianal inflammation extending into the gluteal cleft and towards  the base of the scrotum without defined fluid collection or soft  tissue gas. Correlate with any signs of perianal fistula or other  risk factors for perianal  infection such as inflammatory bowel  disease. Area best described at this time as a phlegmon in the  absence of discrete collection or soft tissue gas. Central portion  is denser raising the question of developing fluid collection in  this area.     Stranding extends into the upper thigh particularly on the LEFT.     Mild enlargement of bilateral pelvic lymph nodes LEFT greater than  RIGHT, likely reactive. Attention on follow-up   Consult with general surgery spoke with Gerre Scull spoke with Dr. Ellouise Newer, advised that they will be admitting the patient to their service and starting the patient on antibiotics  and plan for OR tomorrow.  Tresa Endo PA-C reports general surgery will be starting antibiotic therapy. - Patient reassessed resting comfortably no acute distress, vital signs are stable.  Patient states understanding of plan and is agreeable for admission.  Note: Portions of this report may have been transcribed using voice recognition software. Every effort was made to ensure accuracy; however, inadvertent computerized transcription errors may still be present.  Final Clinical Impression(s) / ED Diagnoses Final diagnoses:  Abscess    Rx / DC Orders ED Discharge Orders     None        Elizabeth Palau 12/27/20 1541    Pollyann Savoy, MD 12/28/20 [phone]

## 2020-12-28 ENCOUNTER — Encounter (HOSPITAL_COMMUNITY): Admission: EM | Disposition: A | Discharge status: Home / Self Care | Payer: Self-pay | Source: Home / Self Care

## 2020-12-28 ENCOUNTER — Other Ambulatory Visit: Payer: Self-pay

## 2020-12-28 ENCOUNTER — Observation Stay (HOSPITAL_COMMUNITY): Payer: Self-pay | Admitting: Anesthesiology

## 2020-12-28 ENCOUNTER — Encounter (HOSPITAL_COMMUNITY): Payer: Self-pay

## 2020-12-28 HISTORY — PX: INCISION AND DRAINAGE ABSCESS: SHX5864

## 2020-12-28 HISTORY — PX: RECTAL EXAM UNDER ANESTHESIA: SHX6399

## 2020-12-28 LAB — SURGICAL PCR SCREEN
MRSA, PCR: NEGATIVE
Staphylococcus aureus: POSITIVE — AB

## 2020-12-28 LAB — CBC
HCT: 38.3 % — ABNORMAL LOW (ref 39.0–52.0)
Hemoglobin: 12.7 g/dL — ABNORMAL LOW (ref 13.0–17.0)
MCH: 30.1 pg (ref 26.0–34.0)
MCHC: 33.2 g/dL (ref 30.0–36.0)
MCV: 90.8 fL (ref 80.0–100.0)
Platelets: 144 10*3/uL — ABNORMAL LOW (ref 150–400)
RBC: 4.22 MIL/uL (ref 4.22–5.81)
RDW: 12.6 % (ref 11.5–15.5)
WBC: 9.4 10*3/uL (ref 4.0–10.5)
nRBC: 0 % (ref 0.0–0.2)

## 2020-12-28 LAB — BASIC METABOLIC PANEL
Anion gap: 6 (ref 5–15)
BUN: 10 mg/dL (ref 6–20)
CO2: 24 mmol/L (ref 22–32)
Calcium: 8.9 mg/dL (ref 8.9–10.3)
Chloride: 107 mmol/L (ref 98–111)
Creatinine, Ser: 0.88 mg/dL (ref 0.61–1.24)
GFR, Estimated: >60 mL/min (ref >60)
Glucose, Bld: 89 mg/dL (ref 70–99)
Potassium: 3.9 mmol/L (ref 3.5–5.1)
Sodium: 137 mmol/L (ref 135–145)

## 2020-12-28 SURGERY — INCISION AND DRAINAGE, ABSCESS
Anesthesia: General

## 2020-12-28 MED ORDER — ONDANSETRON HCL 4 MG/2ML IJ SOLN
INTRAMUSCULAR | Status: DC | PRN
  Administered 2020-12-28: 4 mg via INTRAVENOUS

## 2020-12-28 MED ORDER — DEXAMETHASONE SODIUM PHOSPHATE 10 MG/ML IJ SOLN
INTRAMUSCULAR | Status: DC | PRN
  Administered 2020-12-28: 10 mg via INTRAVENOUS

## 2020-12-28 MED ORDER — ROCURONIUM BROMIDE 10 MG/ML (PF) SYRINGE
PREFILLED_SYRINGE | INTRAVENOUS | Status: AC
  Filled 2020-12-28: qty 10

## 2020-12-28 MED ORDER — PROMETHAZINE HCL 25 MG/ML IJ SOLN: 6.2500 mg | INTRAMUSCULAR | Status: DC | PRN

## 2020-12-28 MED ORDER — CHLORHEXIDINE GLUCONATE CLOTH 2 % EX PADS
6.0000 | MEDICATED_PAD | Freq: Once | CUTANEOUS | Status: AC
  Administered 2020-12-28: 6 via TOPICAL

## 2020-12-28 MED ORDER — MUPIROCIN 2 % EX OINT
1.0000 "application " | TOPICAL_OINTMENT | Freq: Two times a day (BID) | CUTANEOUS | Status: DC
  Administered 2020-12-28 – 2020-12-29 (×3): 1 via NASAL
  Filled 2020-12-28 (×2): qty 22

## 2020-12-28 MED ORDER — OXYCODONE HCL 5 MG/5ML PO SOLN: 5.0000 mg | Freq: Once | ORAL | Status: AC | PRN

## 2020-12-28 MED ORDER — DEXAMETHASONE SODIUM PHOSPHATE 10 MG/ML IJ SOLN
INTRAMUSCULAR | Status: AC
  Filled 2020-12-28: qty 1

## 2020-12-28 MED ORDER — FENTANYL CITRATE (PF) 100 MCG/2ML IJ SOLN
25.0000 ug | INTRAMUSCULAR | Status: DC | PRN
  Administered 2020-12-28: 50 ug via INTRAVENOUS

## 2020-12-28 MED ORDER — FENTANYL CITRATE (PF) 100 MCG/2ML IJ SOLN
INTRAMUSCULAR | Status: AC
  Filled 2020-12-28: qty 2

## 2020-12-28 MED ORDER — KETAMINE HCL 10 MG/ML IJ SOLN
INTRAMUSCULAR | Status: DC | PRN
  Administered 2020-12-28: 35 mg via INTRAVENOUS

## 2020-12-28 MED ORDER — OXYCODONE HCL 5 MG PO TABS
ORAL_TABLET | ORAL | Status: AC
  Filled 2020-12-28: qty 1

## 2020-12-28 MED ORDER — ONDANSETRON HCL 4 MG/2ML IJ SOLN
INTRAMUSCULAR | Status: AC
  Filled 2020-12-28: qty 2

## 2020-12-28 MED ORDER — FENTANYL CITRATE (PF) 100 MCG/2ML IJ SOLN
INTRAMUSCULAR | Status: DC | PRN
  Administered 2020-12-28: 100 ug via INTRAVENOUS
  Administered 2020-12-28 (×2): 50 ug via INTRAVENOUS

## 2020-12-28 MED ORDER — DEXMEDETOMIDINE (PRECEDEX) IN NS 20 MCG/5ML (4 MCG/ML) IV SYRINGE
PREFILLED_SYRINGE | INTRAVENOUS | Status: DC | PRN
  Administered 2020-12-28 (×2): 4 ug via INTRAVENOUS

## 2020-12-28 MED ORDER — LIDOCAINE 2% (20 MG/ML) 5 ML SYRINGE
INTRAMUSCULAR | Status: AC
  Filled 2020-12-28: qty 5

## 2020-12-28 MED ORDER — MIDAZOLAM HCL 2 MG/2ML IJ SOLN
INTRAMUSCULAR | Status: AC
  Filled 2020-12-28: qty 2

## 2020-12-28 MED ORDER — LACTATED RINGERS IV SOLN: INTRAVENOUS | Status: DC | PRN

## 2020-12-28 MED ORDER — CHLORHEXIDINE GLUCONATE CLOTH 2 % EX PADS
6.0000 | MEDICATED_PAD | Freq: Every day | CUTANEOUS | Status: DC
  Administered 2020-12-28: 6 via TOPICAL

## 2020-12-28 MED ORDER — LIDOCAINE 2% (20 MG/ML) 5 ML SYRINGE
INTRAMUSCULAR | Status: DC | PRN
  Administered 2020-12-28: 100 mg via INTRAVENOUS

## 2020-12-28 MED ORDER — MIDAZOLAM HCL 5 MG/5ML IJ SOLN
INTRAMUSCULAR | Status: DC | PRN
  Administered 2020-12-28: 2 mg via INTRAVENOUS

## 2020-12-28 MED ORDER — OXYCODONE HCL 5 MG PO TABS
5.0000 mg | ORAL_TABLET | Freq: Once | ORAL | Status: AC | PRN
  Administered 2020-12-28: 5 mg via ORAL

## 2020-12-28 MED ORDER — PROPOFOL 10 MG/ML IV BOLUS
INTRAVENOUS | Status: DC | PRN
  Administered 2020-12-28: 200 mg via INTRAVENOUS

## 2020-12-28 MED ORDER — PROPOFOL 10 MG/ML IV BOLUS
INTRAVENOUS | Status: AC
  Filled 2020-12-28: qty 20

## 2020-12-28 SURGICAL SUPPLY — 28 items
APL PRP STRL LF DISP 70% ISPRP (MISCELLANEOUS)
BAG COUNTER SPONGE SURGICOUNT (BAG) IMPLANT
BAG SPNG CNTER NS LX DISP (BAG)
BLADE SURG SZ11 CARB STEEL (BLADE) ×2 IMPLANT
CHLORAPREP W/TINT 26 (MISCELLANEOUS) ×1 IMPLANT
COVER SURGICAL LIGHT HANDLE (MISCELLANEOUS) ×2 IMPLANT
DECANTER SPIKE VIAL GLASS SM (MISCELLANEOUS) IMPLANT
DRAIN PENROSE 0.25X18 (DRAIN) ×1 IMPLANT
DRAPE LAPAROSCOPIC ABDOMINAL (DRAPES) IMPLANT
DRAPE LAPAROTOMY TRNSV 102X78 (DRAPES) IMPLANT
DRSG PAD ABDOMINAL 8X10 ST (GAUZE/BANDAGES/DRESSINGS) ×1 IMPLANT
ELECT REM PT RETURN 15FT ADLT (MISCELLANEOUS) ×2 IMPLANT
GAUZE SPONGE 4X4 12PLY STRL (GAUZE/BANDAGES/DRESSINGS) ×1 IMPLANT
GLOVE SURG POLYISO LF SZ7 (GLOVE) ×2 IMPLANT
GLOVE SURG UNDER POLY LF SZ7 (GLOVE) ×2 IMPLANT
GOWN STRL REUS W/TWL LRG LVL3 (GOWN DISPOSABLE) ×2 IMPLANT
GOWN STRL REUS W/TWL XL LVL3 (GOWN DISPOSABLE) ×2 IMPLANT
KIT BASIN OR (CUSTOM PROCEDURE TRAY) ×2 IMPLANT
KIT TURNOVER KIT A (KITS) ×2 IMPLANT
PACK GENERAL/GYN (CUSTOM PROCEDURE TRAY) ×2 IMPLANT
PENCIL SMOKE EVACUATOR (MISCELLANEOUS) IMPLANT
SURGILUBE 2OZ TUBE FLIPTOP (MISCELLANEOUS) IMPLANT
SUT MNCRL AB 4-0 PS2 18 (SUTURE) IMPLANT
SUT SILK 0 SH 30 (SUTURE) ×1 IMPLANT
SWAB COLLECTION DEVICE MRSA (MISCELLANEOUS) ×1 IMPLANT
SWAB CULTURE ESWAB REG 1ML (MISCELLANEOUS) IMPLANT
TOWEL OR 17X26 10 PK STRL BLUE (TOWEL DISPOSABLE) ×2 IMPLANT
TOWEL OR NON WOVEN STRL DISP B (DISPOSABLE) ×2 IMPLANT

## 2020-12-28 NOTE — Anesthesia Postprocedure Evaluation (Signed)
Anesthesia Post Note  Patient: Sandi Mariscal  Procedure(s) Performed: INCISION AND DRAINAGE PERIRECTAL ABSCESS RECTAL EXAM UNDER ANESTHESIA     Patient location during evaluation: PACU Anesthesia Type: General Level of consciousness: awake and alert Pain management: pain level controlled Vital Signs Assessment: post-procedure vital signs reviewed and stable Respiratory status: spontaneous breathing, nonlabored ventilation and respiratory function stable Cardiovascular status: blood pressure returned to baseline and stable Postop Assessment: no apparent nausea or vomiting Anesthetic complications: yes Comments: Patient noted to have small laceration on inferior base of tongue, likely related to LMA placement. Patient c/o mild pain at site. Explained likely mechanism of injury to patient. Patient to follow up if pain persists beyond several days or poor healing noted.   No notable events documented.  Last Vitals:  Vitals:   12/28/20 1445 12/28/20 1500  BP: 137/88 (!) 147/109  Pulse: (!) 53 (!) 57  Resp: 14 15  Temp:  36.5 C  SpO2: 100% 100%    Last Pain:  Vitals:   12/28/20 1440  TempSrc:   PainSc: 2                  Beryle Lathe

## 2020-12-28 NOTE — Op Note (Signed)
Preoperative diagnosis: perirectal abscess  Postoperative diagnosis: same   Procedure: incision and drainage of perirectal abscess  Surgeon: Feliciana Rossetti, M.D.  Asst: none  Anesthesia: general LMA  Indications for procedure: Joel Murphy is a 35 y.o. year old male with symptoms of perianal pain. On exam he had induration consistent with abscess and presents for drainage.  Description of procedure: The patient was brought into the operative suite. Anesthesia was administered with General LMA anesthesia. WHO checklist was applied. The patient was then placed in lithotomy position. The area was prepped and draped in the usual sterile fashion.  The abscess was on the left posterior aspect from the anus. The location of greatest induration and fluctuance was identified A stab incision was made with purulent output. and Cultures were collected and sent. The cavity was probed with instrument and pockets were opened. The area was palpated to express additional purulence. A counterincision was made A penrose was looped through and sutured to itself with 0 silk. The cavity was irrigated with saline. Hemostasis was applied with cautery. Dressing was put in place the patient tolerated the procedure well and was transferred to pacu. All counts were correct.   Findings: small abscess in left posterior perirectal area  Specimen: perirectal abscess  Implant: 1/4" penrose   Blood loss: 10 ml  Local anesthesia: none  Complications: none  Feliciana Rossetti, M.D. General, Bariatric, & Minimally Invasive Surgery [facility]

## 2020-12-28 NOTE — Transfer of Care (Signed)
Immediate Anesthesia Transfer of Care Note  Patient: Joel Murphy  Procedure(s) Performed: INCISION AND DRAINAGE PERIRECTAL ABSCESS RECTAL EXAM UNDER ANESTHESIA  Patient Location: PACU  Anesthesia Type:General  Level of Consciousness: awake, alert  and oriented  Airway & Oxygen Therapy: Patient Spontanous Breathing and Patient connected to face mask oxygen  Post-op Assessment: Report given to RN and Post -op Vital signs reviewed and stable  Post vital signs: Reviewed and stable  Last Vitals:  Vitals Value Taken Time  BP    Temp    Pulse    Resp    SpO2      Last Pain:  Vitals:   12/28/20 1127  TempSrc:   PainSc: 4       Patients Stated Pain Goal: 2 (12/28/20 1127)  Complications: No notable events documented.

## 2020-12-28 NOTE — Anesthesia Preprocedure Evaluation (Addendum)
Anesthesia Evaluation  Patient identified by MRN, date of birth, ID band Patient awake    Reviewed: Allergy & Precautions, NPO status , Patient's Chart, lab work & pertinent test results  History of Anesthesia Complications Negative for: history of anesthetic complications  Airway Mallampati: II  TM Distance: >3 FB Neck ROM: Full    Dental  (+) Dental Advisory Given, Chipped,    Pulmonary Current Smoker and Patient abstained from smoking.,    Pulmonary exam normal        Cardiovascular negative cardio ROS Normal cardiovascular exam     Neuro/Psych negative neurological ROS  negative psych ROS   GI/Hepatic negative GI ROS, Neg liver ROS,   Endo/Other  negative endocrine ROS  Renal/GU negative Renal ROS     Musculoskeletal negative musculoskeletal ROS (+)   Abdominal   Peds  Hematology  (+) anemia ,  Plt 144k    Anesthesia Other Findings   Reproductive/Obstetrics                            Anesthesia Physical Anesthesia Plan  ASA: 2  Anesthesia Plan: General   Post-op Pain Management:    Induction: Intravenous  PONV Risk Score and Plan: 2 and Treatment may vary due to age or medical condition, Ondansetron, Dexamethasone and Midazolam  Airway Management Planned: Oral ETT and LMA  Additional Equipment: None  Intra-op Plan:   Post-operative Plan: Extubation in OR  Informed Consent: I have reviewed the patients History and Physical, chart, labs and discussed the procedure including the risks, benefits and alternatives for the proposed anesthesia with the patient or authorized representative who has indicated his/her understanding and acceptance.     Dental advisory given  Plan Discussed with: CRNA and Anesthesiologist  Anesthesia Plan Comments: (LMA if supine, ETT if prone )       Anesthesia Quick Evaluation

## 2020-12-28 NOTE — Anesthesia Procedure Notes (Signed)
Procedure Name: LMA Insertion Date/Time: 12/28/2020 12:54 PM Performed by: Vennie Salsbury D, CRNA Pre-anesthesia Checklist: Patient identified, Emergency Drugs available, Suction available and Patient being monitored Patient Re-evaluated:Patient Re-evaluated prior to induction Oxygen Delivery Method: Circle system utilized Preoxygenation: Pre-oxygenation with 100% oxygen Induction Type: IV induction Ventilation: Mask ventilation without difficulty LMA: LMA inserted LMA Size: 5.0 Tube type: Oral Number of attempts: 1 Placement Confirmation: positive ETCO2 and breath sounds checked- equal and bilateral Tube secured with: Tape Dental Injury: Teeth and Oropharynx as per pre-operative assessment

## 2020-12-28 NOTE — Discharge Instructions (Signed)
I recommend a sitz bath or shower at least twice daily, more often as needed if you are having bowel movements. After this dry area very well and get clean dry pad into undergarment. You can use ABD pads from a medical supply or drug store or you can use menstrual/maternity pads. Menstrual pads tend to be cheaper.

## 2020-12-28 NOTE — Progress Notes (Signed)
Progress Note  Day of Surgery  Subjective: Patient with some questions about OR today. Wanted to know how long he would be asleep for in OR. 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 We discussed that he may need to be out 1-2 weeks pending how abscess heals. He reported no further questions and is agreeable to proceed.   Objective: Vital signs in last 24 hours: Temp:  [97.7 F (36.5 C)-98.7 F (37.1 C)] 97.7 F (36.5 C) (07/12 0401) Pulse Rate:  [57-86] 57 (07/12 0401) Resp:  [14-19] 15 (07/12 0401) BP: (120-154)/(73-104) 141/89 (07/12 0401) SpO2:  [97 %-100 %] 99 % (07/12 0401) Weight:  [74.8 kg] 74.8 kg (07/12 0401) Last BM Date: 12/27/20 (pt stated had smal BM this AM)  Intake/Output from previous day: 07/11 0701 - 07/12 0700 In: 2077.2 [I.V.:277.2; IV Piggyback:1800] Out: -  Intake/Output this shift: No intake/output data recorded.  PE: General: pleasant, WD, WN male who is laying in bed in NAD Lungs: Respiratory effort nonlabored GU: deferred since patient scheduled for OR this afternoon  MS: all 4 extremities are symmetrical with no cyanosis, clubbing, or edema. Skin: warm and dry with no masses, lesions, or rashes Psych: A&Ox3 with an appropriate affect.    Lab Results:  Recent Labs    12/27/20 1605 12/28/20 0411  WBC 11.0* 9.4  HGB 13.2 12.7*  HCT 39.2 38.3*  PLT 153 144*   BMET Recent Labs    12/27/20 1605 12/28/20 0411  NA 136 137  K 3.7 3.9  CL 105 107  CO2 26 24  GLUCOSE 148* 89  BUN 9 10  CREATININE 0.79 0.88  CALCIUM 8.8* 8.9   PT/INR No results for input(s): LABPROT, INR in the last 72 hours. CMP     Component Value Date/Time   NA 137 12/28/2020 0411   K 3.9 12/28/2020 0411   CL 107 12/28/2020 0411   CO2 24 12/28/2020 0411   GLUCOSE 89 12/28/2020 0411   BUN 10 12/28/2020 0411   CREATININE 0.88 12/28/2020 0411   CALCIUM 8.9 12/28/2020 0411   PROT 7.1 12/27/2020 1605    ALBUMIN 3.3 (L) 12/27/2020 1605   AST 29 12/27/2020 1605   ALT 44 12/27/2020 1605   ALKPHOS 53 12/27/2020 1605   BILITOT 0.4 12/27/2020 1605   GFRNONAA >60 12/28/2020 0411   GFRAA >60 08/26/2016 0046   Lipase  No results found for: LIPASE     Studies/Results: CT PELVIS W CONTRAST  Result Date: 12/27/2020 CLINICAL DATA:  Suspicion for rectal abscess. EXAM: CT PELVIS WITH CONTRAST TECHNIQUE: Multidetector CT imaging of the pelvis was performed using the standard protocol following the bolus administration of intravenous contrast. CONTRAST:  85mL OMNIPAQUE IOHEXOL 350 MG/ML SOLN COMPARISON:  None FINDINGS: Urinary Tract: No distal ureteral dilation. Urinary bladder with smooth contours. Bowel: Perianal stranding extending into the LEFT gluteal cleft. Low attenuation in the LEFT perianal region. Potential ill-defined, developing fluid collection in this location centrally but without well-defined collection at this time. In total stranding in the axial plane measuring approximately 3.8 x 1.5 cm. More pronounced low attenuation suggested centrally on the coronal images approximately 2.8 x 0.6 cm. No gas in the soft tissues. Visualized gastrointestinal tract is otherwise unremarkable No discrete tract extending from the sphincter complex or anus to account for these findings. Mild stranding in the medial upper thigh in the subcutaneous fight bilaterally. Vascular/Lymphatic: Mild enlargement of bilateral pelvic  lymph nodes LEFT greater than RIGHT. Most retained fatty hila. Patent pelvic vasculature with normal caliber. Reproductive:  Unremarkable. Other:  No pelvic fluid collection. On coronal images suggestion of stranding in the gluteal cleft that extends towards the base of the scrotum. Musculoskeletal: No acute bone finding. IMPRESSION: Perianal inflammation extending into the gluteal cleft and towards the base of the scrotum without defined fluid collection or soft tissue gas. Correlate with any  signs of perianal fistula or other risk factors for perianal infection such as inflammatory bowel disease. Area best described at this time as a phlegmon in the absence of discrete collection or soft tissue gas. Central portion is denser raising the question of developing fluid collection in this area. Stranding extends into the upper thigh particularly on the LEFT. Mild enlargement of bilateral pelvic lymph nodes LEFT greater than RIGHT, likely reactive. Attention on follow-up Electronically Signed   By: Donzetta Kohut M.D.   On: 12/27/2020 15:11    Anti-infectives: Anti-infectives (From admission, onward)    Start     Dose/Rate Route Frequency Ordered Stop   12/27/20 2200  piperacillin-tazobactam (ZOSYN) IVPB 3.375 g        3.375 g 12.5 mL/hr over 240 Minutes Intravenous Every 8 hours 12/27/20 1547     12/27/20 1600  piperacillin-tazobactam (ZOSYN) IVPB 3.375 g        3.375 g 100 mL/hr over 30 Minutes Intravenous  Once 12/27/20 1550 12/27/20 1630        Assessment/Plan Perirectal abscess - CT 7/11 with perianal inflammation from gluteal cleft to base of the scrotum, question possible developing fluid collection in central portion of phlegmonous area - WBC 9, afeb - plan for EUA and I&D in OR today   FEN: NPO, IVF VTE:SCD's ID: Zosyn   Substance use - fentanyl, on suboxone(OTC), ETOH use, tobacco use  LOS: 0 days    Juliet Rude, [facility] 12/28/2020, 10:18 AM Please see Amion for pager number during day hours 7:00am-4:30pm

## 2020-12-29 ENCOUNTER — Encounter (HOSPITAL_COMMUNITY): Payer: Self-pay | Admitting: General Surgery

## 2020-12-29 MED ORDER — OXYCODONE HCL 5 MG PO TABS: 5.0000 mg | ORAL_TABLET | Freq: Four times a day (QID) | ORAL | 0 refills | Status: DC | PRN

## 2020-12-29 MED ORDER — AMOXICILLIN-POT CLAVULANATE 875-125 MG PO TABS: 1.0000 | ORAL_TABLET | Freq: Two times a day (BID) | ORAL | 0 refills | Status: AC

## 2020-12-29 NOTE — Progress Notes (Signed)
Pt alert and oriented. Tolerating diet. D/C instructions given, pt d/cd to home. 

## 2020-12-29 NOTE — Progress Notes (Signed)
1 Day Post-Op    CC: buttocks abscess  Subjective:   Site looks fine, he needs a cigarette and to get home for some court issues.  We discussed care of the wound and follow up. Objective: Vital signs in last 24 hours: Temp:  [97.6 F (36.4 C)-98.9 F (37.2 C)] 98.5 F (36.9 C) (07/13 0521) Pulse Rate:  [51-93] 64 (07/13 0521) Resp:  [12-19] 16 (07/13 0521) BP: (133-153)/(75-109) 153/87 (07/13 0521) SpO2:  [99 %-100 %] 100 % (07/13 0521) Weight:  [102.1 kg] 102.1 kg (07/12 1127) Last BM Date: 12/27/20 540 PO 1300 IV Voided x 10 Afebrile, VSS No labs Intake/Output from previous day: 07/12 0701 - 07/13 0700 In: 1931 [P.O.:540; I.V.:1191; IV Piggyback:200] Out: -  Intake/Output this shift: No intake/output data recorded.  General appearance: alert, cooperative, and anxious Resp: clear to auscultation bilaterally Rectal site, soft, no drainage, erythema is better, drain in place  Lab Results:  Recent Labs    12/27/20 1605 12/28/20 0411  WBC 11.0* 9.4  HGB 13.2 12.7*  HCT 39.2 38.3*  PLT 153 144*    BMET Recent Labs    12/27/20 1605 12/28/20 0411  NA 136 137  K 3.7 3.9  CL 105 107  CO2 26 24  GLUCOSE 148* 89  BUN 9 10  CREATININE 0.79 0.88  CALCIUM 8.8* 8.9   PT/INR No results for input(s): LABPROT, INR in the last 72 hours.  Recent Labs  Lab 12/27/20 1605  AST 29  ALT 44  ALKPHOS 53  BILITOT 0.4  PROT 7.1  ALBUMIN 3.3*     Lipase  No results found for: LIPASE   Medications:  Chlorhexidine Gluconate Cloth  6 each Topical Q0600   docusate sodium  100 mg Oral BID   enoxaparin (LOVENOX) injection  40 mg Subcutaneous Q24H   folic acid  1 mg Oral Daily   ketorolac  15 mg Intravenous Q6H   multivitamin with minerals  1 tablet Oral Daily   mupirocin ointment  1 application Nasal BID   thiamine  100 mg Oral Daily   Or   thiamine  100 mg Intravenous Daily    Assessment/Plan POD#1 Perirectal abscess  incision and drainage of perirectal  abscess,12/28/20, [place] - CT 7/11 with perianal inflammation from gluteal cleft to base of the scrotum, question possible developing fluid collection in central portion of phlegmonous area - WBC 9, afeb - plan for EUA and I&D in OR today   FEN: NPO, IVF XBM:WUXLKGM ID: Zosyn 7/11>> day 3   Substance use - fentanyl, on suboxone(OTC), ETOH use, tobacco use  Plan:  Home with 5 days of ABX, I recommended he see some one for substance use, I told him I would give him 15 oxycodone.        LOS: 1 day    Teagyn Fishel 12/29/2020 Please see Amion

## 2020-12-31 LAB — AEROBIC/ANAEROBIC CULTURE W GRAM STAIN (SURGICAL/DEEP WOUND)

## 2021-01-13 NOTE — Discharge Summary (Signed)
Physician Discharge Summary  Patient ID: Joel Murphy MRN: [id number] DOB/AGE: [birth year] 37 y.o.  Admit date: 12/27/2020 Discharge date: 12/29/2020  Admission Diagnoses:   Perirectal abscess Substance use - fentanyl, on suboxone(OTC), ETOH use, tobacco use  Discharge Diagnoses:  Active Problems:   Perirectal abscess   PROCEDURES:    incision and drainage of perirectal abscess, 12/28/20, Dr. Franky Macho [facility] Course:  Patient is a 35 year old male with no other reported PMH who presented to [facility] today with concern for perirectal abscess. He reports perirectal pain started Friday 12/24/20, 3 days ago and is sharp and severe, constant. Pain is worsened with sitting and nothing has alleviated. He reports  pain with defecation, no BM x 3 days.  Pain voiding and has to sit to void.  . Denied fever, chills, chest pain, SOB, hematochezia or melena. NKDA. He has a history of heavy ETOH use, and quit last week.  He has been on opioids on and off since 2007.  He had go off but has had a relapse and stopped last week also.  He is getting suboxone on the street and is taking 8 mg BID.He also smokes and is trying to quit.  He had a court date today and wanted to complete that before coming for assistance.  He reports a buttocks abscess 15 years ago, not sure why.  Resolved on it's own.    He was admitted and underwent I&D of perirectal abscess by Dr. Sheliah Hatch.  He did well post op and was discharged home the following AM on 5 days of antibiotics, follow up in the office.  His current drugs are not prescription and we recommended he see a physician to help with his substance use.    CBC Latest Ref Rng & Units 12/28/2020 12/27/2020 12/27/2020  WBC 4.0 - 10.5 K/uL 9.4 11.0(H) 12.1(H)  Hemoglobin 13.0 - 17.0 g/dL 12.7(L) 13.2 14.3  Hematocrit 39.0 - 52.0 % 38.3(L) 39.2 42.6  Platelets 150 - 400 K/uL 144(L) 153 155    CMP Latest Ref Rng & Units 12/28/2020 12/27/2020 12/27/2020  Glucose 70 - 99 mg/dL 89  619(J) 093(O)  BUN 6 - 20 mg/dL 10 9 11   Creatinine 0.61 - 1.24 mg/dL 6.71 2.45  Sodium 135 - 145 mmol/L 137 136 138  Potassium 3.5 - 5.1 mmol/L 3.9 3.7 4.4  Chloride 98 - 111 mmol/L 107 105 105  CO2 22 - 32 mmol/L 24 26 28   Calcium 8.9 - 10.3 mg/dL 8.9 8.09) 9.3  Total Protein 6.5 - 8.1 g/dL - 7.1 -  Total Bilirubin 0.3 - 1.2 mg/dL - 0.4 -  Alkaline Phos 38 - 126 U/L - 53 -  AST 15 - 41 U/L - 29 -  ALT 0 - 44 U/L - 44 -           Disposition: Discharge disposition: 01-Home or Self Care       Discharge Instructions     Change dressing (specify)   Complete by: As directed    Dressing change: 1 times per day using dry gauze.   Diet - low sodium heart healthy   Complete by: As directed       Allergies as of 12/29/2020   No Known Allergies      Medication List     STOP taking these medications    HYDROcodone-acetaminophen 5-325 MG tablet Commonly known as: NORCO/VICODIN       TAKE these medications    acetaminophen 500 MG tablet  Commonly known as: TYLENOL Take 1,000 mg by mouth every 6 (six) hours as needed for moderate pain.   Buprenorphine HCl-Naloxone HCl 8-2 MG Film Place 1 Film under the tongue daily.   oxyCODONE 5 MG immediate release tablet Commonly known as: Oxy IR/ROXICODONE Take 1 tablet (5 mg total) by mouth every 6 (six) hours as needed for severe pain or breakthrough pain.       ASK your doctor about these medications    amoxicillin-clavulanate 875-125 MG tablet Commonly known as: Augmentin Take 1 tablet by mouth 2 (two) times daily for 7 days. Ask about: Should I take this medication?               Discharge Care Instructions  (From admission, onward)           Start     Ordered   12/29/20 0000  Change dressing (specify)       Comments: Dressing change: 1 times per day using dry gauze.   12/29/20 1005            Follow-up Information     [facility], [place]. Go on 01/06/2021.   Specialty:  General Surgery Why: For wound re-check. Appointment scheduled for 1:45 PM. Please arrive 30 min prior to appointment time for check in. Contact information: [address] [place] [address] [phone]                 Signed: Sherrie George 01/13/2021, 5:06 PM

## 2021-07-17 ENCOUNTER — Emergency Department (HOSPITAL_COMMUNITY)
Admission: EM | Admit: 2021-07-17 | Discharge: 2021-07-17 | Disposition: A | Discharge status: Home / Self Care | Payer: 59 | Attending: Emergency Medicine | Admitting: Emergency Medicine

## 2021-07-17 ENCOUNTER — Emergency Department (HOSPITAL_COMMUNITY): Payer: 59

## 2021-07-17 ENCOUNTER — Encounter (HOSPITAL_COMMUNITY): Payer: Self-pay

## 2021-07-17 DIAGNOSIS — S93401A Sprain of unspecified ligament of right ankle, initial encounter: Secondary | ICD-10-CM | POA: Diagnosis not present

## 2021-07-17 DIAGNOSIS — M25571 Pain in right ankle and joints of right foot: Secondary | ICD-10-CM | POA: Diagnosis present

## 2021-07-17 DIAGNOSIS — X509XXA Other and unspecified overexertion or strenuous movements or postures, initial encounter: Secondary | ICD-10-CM | POA: Diagnosis not present

## 2021-07-17 DIAGNOSIS — Y9339 Activity, other involving climbing, rappelling and jumping off: Secondary | ICD-10-CM | POA: Diagnosis not present

## 2021-07-17 DIAGNOSIS — Y990 Civilian activity done for income or pay: Secondary | ICD-10-CM | POA: Diagnosis not present

## 2021-07-17 MED ORDER — HYDROCODONE-ACETAMINOPHEN 5-325 MG PO TABS
1.0000 | ORAL_TABLET | Freq: Once | ORAL | Status: AC
  Administered 2021-07-17: 1 via ORAL
  Filled 2021-07-17: qty 1

## 2021-07-17 MED ORDER — NAPROXEN 500 MG PO TABS: 500.0000 mg | ORAL_TABLET | Freq: Two times a day (BID) | ORAL | 0 refills | Status: DC

## 2021-07-17 NOTE — ED Triage Notes (Signed)
Pt reports jumping off a box truck at work on Wednesday and landing on his right foot wrong. Pt now endorses increased pain and swelling to right ankle.

## 2021-07-17 NOTE — ED Provider Notes (Signed)
[facility]-EMERGENCY DEPT Provider Note   CSN: [id number] Arrival date & time: 07/17/21  0750     History  Chief Complaint  Patient presents with   Ankle Pain    Joel Murphy is a 36 y.o. male who presents to the ED due to right ankle pain.  Patient states he jumped off the back of a box truck 4 days ago.  Patient states originally, his ankle did not hurt however, woke up this morning with severe pain on the medial aspect with surrounding edema. He states he "self medicates with dope" for pain.  Denies fever and chills.  No other injuries.  Denies numbness/tingling.  No previous right ankle injury.     Home Medications Prior to Admission medications   Medication Sig Start Date End Date Taking? Authorizing Provider  naproxen (NAPROSYN) 500 MG tablet Take 1 tablet (500 mg total) by mouth 2 (two) times daily. 07/17/21  Yes Tahmid Stonehocker, Merla Riches, PA-C  acetaminophen (TYLENOL) 500 MG tablet Take 1,000 mg by mouth every 6 (six) hours as needed for moderate pain.    [provider]  Buprenorphine HCl-Naloxone HCl 8-2 MG FILM Place 1 Film under the tongue daily.    [provider]  oxyCODONE (OXY IR/ROXICODONE) 5 MG immediate release tablet Take 1 tablet (5 mg total) by mouth every 6 (six) hours as needed for severe pain or breakthrough pain. 12/29/20   Sherrie George, PA-C      Allergies    Patient has no known allergies.    Review of Systems   Review of Systems  Constitutional:  Negative for chills and fever.  Musculoskeletal:  Positive for arthralgias, gait problem and joint swelling.   Physical Exam Updated Vital Signs BP (!) 198/115 (BP Location: Left Arm)    Pulse (!) 116    Temp 97.8 F (36.6 C) (Oral)    Resp 20    Ht 5\' 11"  (1.803 m)    Wt 95.3 kg    SpO2 96%    BMI 29.29 kg/m  Physical Exam Vitals and nursing note reviewed.  Constitutional:      General: He is not in acute distress.    Appearance: He is not ill-appearing.  HENT:      Head: Normocephalic.  Eyes:     Pupils: Pupils are equal, round, and reactive to light.  Cardiovascular:     Rate and Rhythm: Normal rate and regular rhythm.     Pulses: Normal pulses.     Heart sounds: Normal heart sounds. No murmur heard.   No friction rub. No gallop.  Pulmonary:     Effort: Pulmonary effort is normal.     Breath sounds: Normal breath sounds.  Abdominal:     General: Abdomen is flat. There is no distension.     Palpations: Abdomen is soft.     Tenderness: There is no abdominal tenderness. There is no guarding or rebound.  Musculoskeletal:        General: Normal range of motion.     Cervical back: Neck supple.     Comments: Tenderness throughout medial aspect of right ankle with surrounding edema.  No erythema or warmth.  Pedal pulses palpable.  No tenderness throughout proximal fibula.  Full range of motion of right ankle, toes, and knee.  Skin:    General: Skin is warm and dry.  Neurological:     General: No focal deficit present.     Mental Status: He is alert.  Psychiatric:  Mood and Affect: Mood normal.        Behavior: Behavior normal.    ED Results / Procedures / Treatments   Labs (all labs ordered are listed, but only abnormal results are displayed) Labs Reviewed - No data to display  EKG None  Radiology DG Ankle Complete Right  Result Date: 07/17/2021 CLINICAL DATA:  Ankle pain and swelling. EXAM: RIGHT ANKLE - COMPLETE 3+ VIEW COMPARISON:  None. FINDINGS: There is no visualized fracture or dislocation. Small joint effusion. There is no evidence of arthropathy or other focal bone abnormality. Soft tissues are unremarkable. IMPRESSION: No acute fracture or dislocation. Small joint effusion. Electronically Signed   By: Dahlia Bailiff M.D.   On: 07/17/2021 08:39    Procedures Procedures    Medications Ordered in ED Medications  HYDROcodone-acetaminophen (NORCO/VICODIN) 5-325 MG per tablet 1 tablet (has no administration in time range)     ED Course/ Medical Decision Making/ A&P                           Medical Decision Making Amount and/or Complexity of Data Reviewed Radiology: ordered and independent interpretation performed. Decision-making details documented in ED Course.  Risk Prescription drug management.   36 year old male presents to the ED due to right ankle pain after jumping out of a truck 4 days ago.  No previous right ankle injury.  Upon arrival, stable vitals.  Triage noted patient be tachycardic at 116 however, during my initial evaluation, patient's heart rate in the 80s.  Tenderness throughout medial aspect of right ankle with surrounding edema.  No erythema or warmth.  Full range of motion of right ankle and all toes.  Low suspicion for septic joint.  No tenderness throughout proximal fibula.  X-ray ordered at triage which I personally reviewed and interpreted which is negative for any bony fractures.  Does show small joint effusion.  Agree with radiology interpretation.  Patient placed in ankle splint.  He already has crutches from home.  Patient given pain medication here in the ED.  Patient discharged with naproxen as needed for pain.  RICE discussed with patient.  Advised patient to follow-up with PCP if symptoms not improve over the next week. Strict ED precautions discussed with patient. Patient states understanding and agrees to plan. Patient discharged home in no acute distress and stable vitals.        Final Clinical Impression(s) / ED Diagnoses Final diagnoses:  Sprain of right ankle, unspecified ligament, initial encounter    Rx / DC Orders ED Discharge Orders          Ordered    naproxen (NAPROSYN) 500 MG tablet  2 times daily        07/17/21 0850              Suzy Bouchard, PA-C 07/17/21 [id number]    Luna Fuse, MD 07/20/21 [id number]

## 2021-07-17 NOTE — ED Notes (Signed)
Pt refused ortho boot. Pt requested ACE wrap instead. Applied by this RN

## 2021-07-17 NOTE — Discharge Instructions (Addendum)
It was a pleasure taking care of you today. As discussed, your x-ray did not show any broken bones. Continue to ice and elevated your leg. I am sending you home with pain medication. Take as needed for pain. Follow-up with PCP if symptoms do not improve over the next week. Return to the ER for new or worsening symptoms.

## 2021-08-19 ENCOUNTER — Emergency Department (HOSPITAL_COMMUNITY)
Admission: EM | Admit: 2021-08-19 | Discharge: 2021-08-19 | Disposition: A | Discharge status: Home / Self Care | Payer: 59 | Attending: Emergency Medicine | Admitting: Emergency Medicine

## 2021-08-19 ENCOUNTER — Encounter (HOSPITAL_COMMUNITY): Payer: Self-pay

## 2021-08-19 DIAGNOSIS — R110 Nausea: Secondary | ICD-10-CM | POA: Insufficient documentation

## 2021-08-19 DIAGNOSIS — Z79899 Other long term (current) drug therapy: Secondary | ICD-10-CM | POA: Diagnosis not present

## 2021-08-19 DIAGNOSIS — F1110 Opioid abuse, uncomplicated: Secondary | ICD-10-CM

## 2021-08-19 MED ORDER — ONDANSETRON HCL 4 MG PO TABS: 4.0000 mg | ORAL_TABLET | Freq: Four times a day (QID) | ORAL | 0 refills | Status: DC

## 2021-08-19 NOTE — ED Notes (Signed)
Pt left before being given AVS ?

## 2021-08-19 NOTE — Discharge Instructions (Addendum)
Return for any problem. ? ?[facility] - [phone] ?

## 2021-08-19 NOTE — ED Provider Notes (Signed)
?[facility]-EMERGENCY DEPT ?Provider Note ? ? ?CSN: [id number] ?Arrival date & time: 08/19/21  1025 ? ?  ? ?History ? ?Chief Complaint  ?Patient presents with  ? Drug / Alcohol Assessment  ? ? ?Joel Murphy is a 36 y.o. male. ? ?36 year old male with prior medical history as detailed below presents for evaluation.  Patient reports that he is seeking detox assistance.  He reports longstanding use of fentanyl.  He typically snorts crushed pills. ? ?He reports that he has been clean for 24 hours. ? ?He complains of nausea.  He complains of skin crawling. ? ?He is otherwise without complaint. ? ?The history is provided by the patient and medical records.  ?Drug / Alcohol Assessment ?Similar prior episodes: yes   ?Severity:  Mild ?Onset quality:  Gradual ?Duration:  1 day ?Timing:  Sporadic ?Progression:  Waxing and waning ?Chronicity:  New ? ?  ? ?Home Medications ?Prior to Admission medications   ?Medication Sig Start Date End Date Taking? Authorizing Provider  ?ondansetron (ZOFRAN) 4 MG tablet Take 1 tablet (4 mg total) by mouth every 6 (six) hours. 08/19/21  Yes Wynetta Fines, MD  ?acetaminophen (TYLENOL) 500 MG tablet Take 1,000 mg by mouth every 6 (six) hours as needed for moderate pain.    [provider]  ?Buprenorphine HCl-Naloxone HCl 8-2 MG FILM Place 1 Film under the tongue daily.    [provider]  ?naproxen (NAPROSYN) 500 MG tablet Take 1 tablet (500 mg total) by mouth 2 (two) times daily. 07/17/21   Mannie Stabile, PA-C  ?oxyCODONE (OXY IR/ROXICODONE) 5 MG immediate release tablet Take 1 tablet (5 mg total) by mouth every 6 (six) hours as needed for severe pain or breakthrough pain. 12/29/20   Sherrie George, PA-C  ?   ? ?Allergies    ?Patient has no known allergies.   ? ?Review of Systems   ?Review of Systems  ?All other systems reviewed and are negative. ? ?Physical Exam ?Updated Vital Signs ?BP 124/81 (BP Location: Right Arm)   Pulse 89   Temp 98 ?F (36.7  ?C) (Oral)   Resp 18   SpO2 100%  ?Physical Exam ?Vitals and nursing note reviewed.  ?Constitutional:   ?   General: He is not in acute distress. ?   Appearance: Normal appearance. He is well-developed.  ?HENT:  ?   Head: Normocephalic and atraumatic.  ?Eyes:  ?   Conjunctiva/sclera: Conjunctivae normal.  ?   Pupils: Pupils are equal, round, and reactive to light.  ?Cardiovascular:  ?   Rate and Rhythm: Normal rate and regular rhythm.  ?   Heart sounds: Normal heart sounds.  ?Pulmonary:  ?   Effort: Pulmonary effort is normal. No respiratory distress.  ?   Breath sounds: Normal breath sounds.  ?Abdominal:  ?   General: There is no distension.  ?   Palpations: Abdomen is soft.  ?   Tenderness: There is no abdominal tenderness.  ?Musculoskeletal:     ?   General: No deformity. Normal range of motion.  ?   Cervical back: Normal range of motion and neck supple.  ?Skin: ?   General: Skin is warm and dry.  ?Neurological:  ?   General: No focal deficit present.  ?   Mental Status: He is alert and oriented to person, place, and time.  ? ? ?ED Results / Procedures / Treatments   ?Labs ?(all labs ordered are listed, but only abnormal results are displayed) ?Labs  Reviewed - No data to display ? ?EKG ?None ? ?Radiology ?No results found. ? ?Procedures ?Procedures  ? ? ?Medications Ordered in ED ?Medications - No data to display ? ?ED Course/ Medical Decision Making/ A&P ?  ?                        ?Medical Decision Making ?Risk ?Prescription drug management. ? ? ? ?Medical Screen Complete ? ?This patient presented to the ED with complaint of seeking narcotic detox. ? ?This complaint involves an extensive number of treatment options. The initial differential diagnosis includes, but is not limited to, chronic detox, narcotic withdrawal, etc. ? ?This presentation is: Acute, Self-Limited, Previously Undiagnosed, Uncertain Prognosis, Complicated, Systemic Symptoms, and Threat to Life/Bodily Function ?Patient with longstanding  history of fentanyl use and abuse. ? ?Patient is seeking detox assistance. ? ?Patient's last use was 24 hours ago. ? ?Patient with minimal withdrawal symptoms -most prominent is nausea. ? ?Patient provided with prescription for outpatient Zofran. ? ?He is also provided with outpatient resources to help him with his detox. ? ?Importance of close follow-up stressed.  Strict return precautions given and understood. ? ? ?Co morbidities that complicated the patient's evaluation ? ?History of illicit drug use ? ? ?Additional history obtained: ? ?External records from outside sources obtained and reviewed including prior ED visits and prior Inpatient records.  ? ? ?Problem List / ED Course: ? ?Seeking detox from fentanyl ? ? ?Reevaluation: ? ?After the interventions noted above, I reevaluated the patient and found that they have: stayed the same ? ?Disposition: ? ?After consideration of the diagnostic results and the patients response to treatment, I feel that the patent would benefit from close outpatient follow-up.  ? ? ? ? ? ? ? ? ?Final Clinical Impression(s) / ED Diagnoses ?Final diagnoses:  ?Narcotic abuse (HCC)  ? ? ?Rx / DC Orders ?ED Discharge Orders   ? ?      Ordered  ?  ondansetron (ZOFRAN) 4 MG tablet  Every 6 hours       ? 08/19/21 1103  ? ?  ?  ? ?  ? ? ?  ?Wynetta Fines, MD ?08/19/21 1112 ? ?

## 2021-08-19 NOTE — ED Triage Notes (Signed)
Pt arrived via POV, requesting detox/resources for fentanyl detox. Denies any other drug or alcohol use. Last use 24 hrs ago ?

## 2021-08-29 ENCOUNTER — Other Ambulatory Visit (HOSPITAL_COMMUNITY): Payer: Self-pay

## 2021-08-29 MED ORDER — BUPRENORPHINE HCL-NALOXONE HCL 8-2 MG SL FILM
1.0000 | ORAL_FILM | Freq: Three times a day (TID) | SUBLINGUAL | 0 refills | Status: DC
  Filled 2021-08-29: qty 42, 14d supply, fill #0

## 2021-09-06 ENCOUNTER — Other Ambulatory Visit (HOSPITAL_COMMUNITY): Payer: Self-pay

## 2021-12-14 IMAGING — CT CT PELVIS W/ CM
2 of 3 series · 16 of 46 positions shown, 18 images · IV contrast (OMNIPAQUE 350)
Comparison: None

CLINICAL DATA: Suspicion for rectal abscess.

EXAM:
CT PELVIS WITH CONTRAST
TECHNIQUE: Multidetector CT imaging of the pelvis was performed using the
standard protocol following the bolus administration of intravenous
contrast.
CONTRAST:  80mL OMNIPAQUE IOHEXOL 350 MG/ML SOLN

[Series 2: axial st · axial · 0.79mm/px · z∈[-331,-51]mm · 13 of 66 slices shown, 15 images]
[im 5/66  soft-tissue]
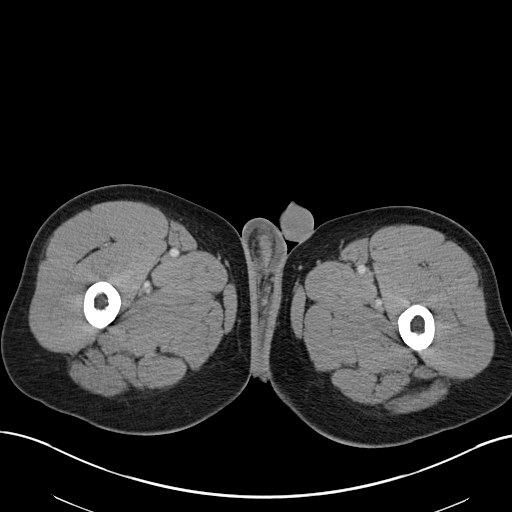
[im 5/66  bone]
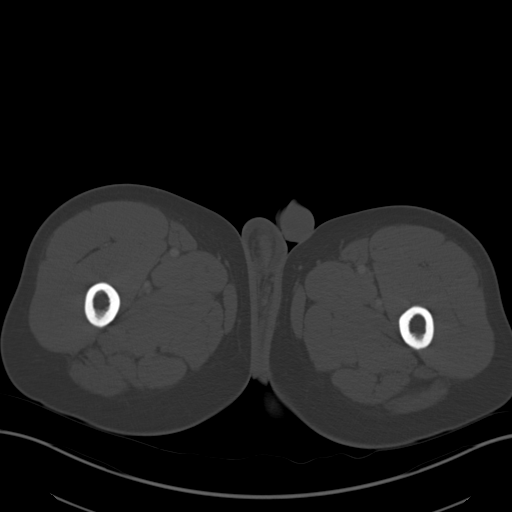
[im 9/66  soft-tissue]
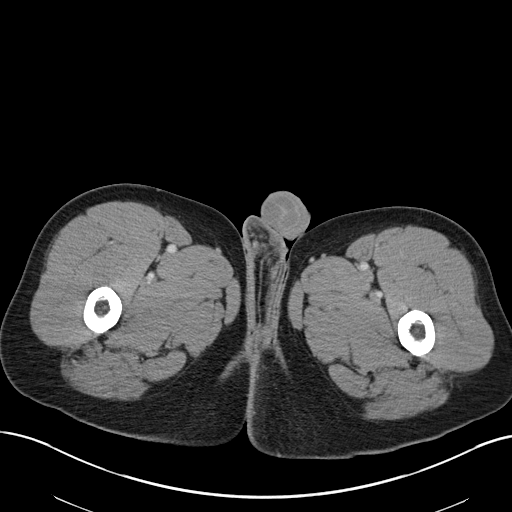
[im 13/66  soft-tissue]
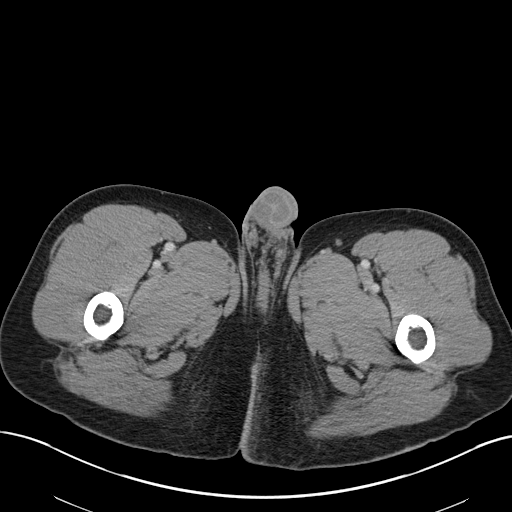
[im 19/66  soft-tissue]
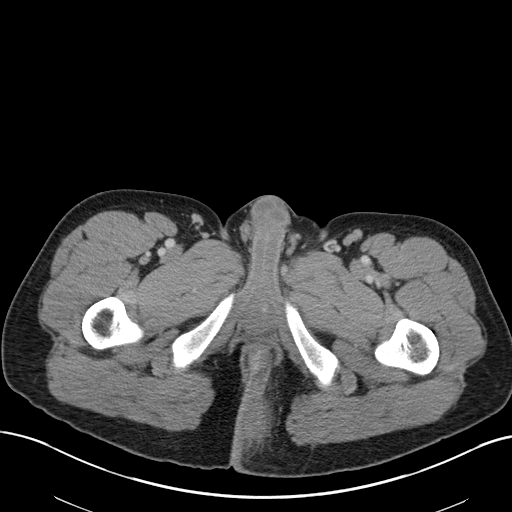
[im 24/66  soft-tissue]
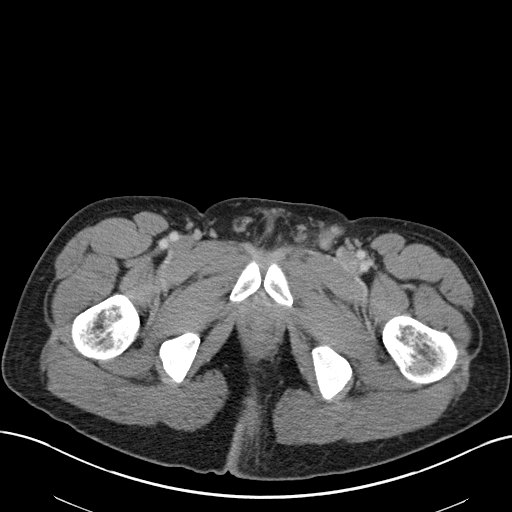
[im 28/66  soft-tissue]
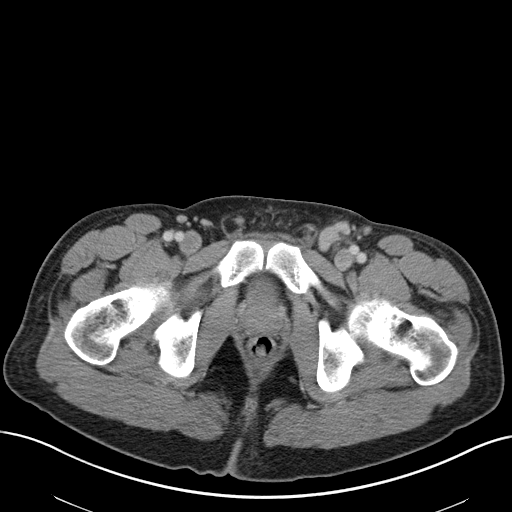
[im 34/66  soft-tissue]
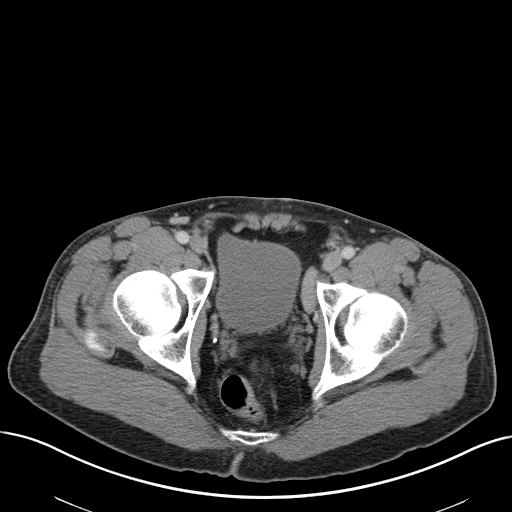
[im 38/66  soft-tissue]
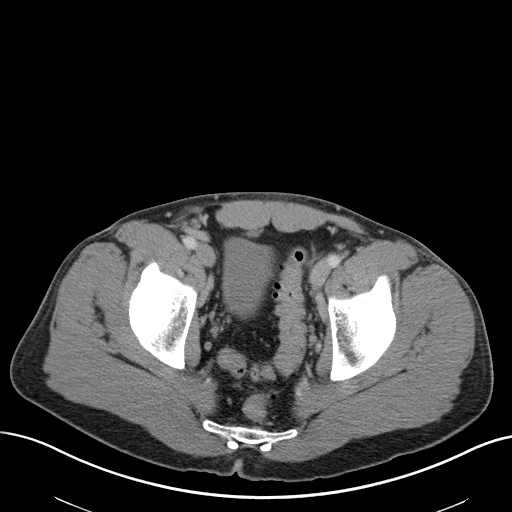
[im 42/66  soft-tissue]
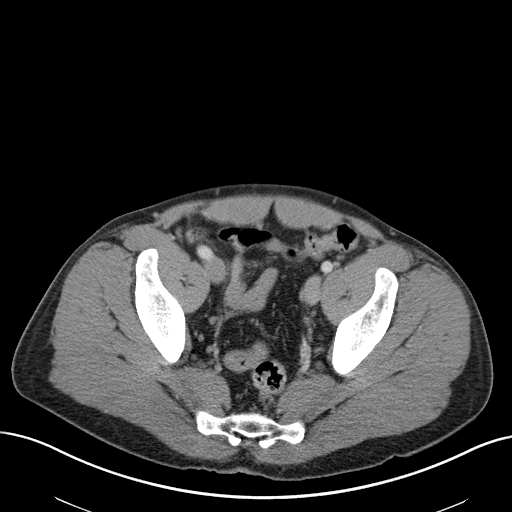
[im 42/66  bone]
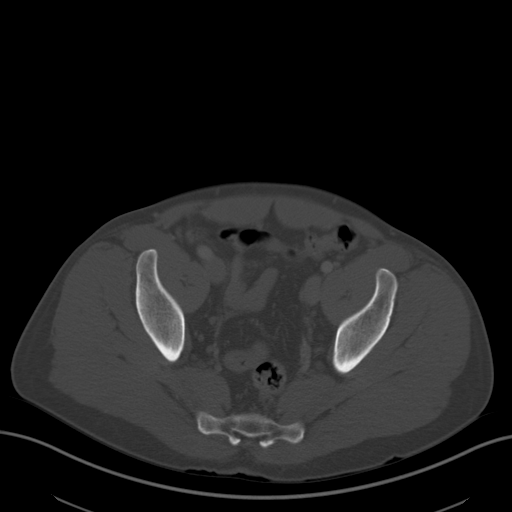
[im 47/66  soft-tissue]
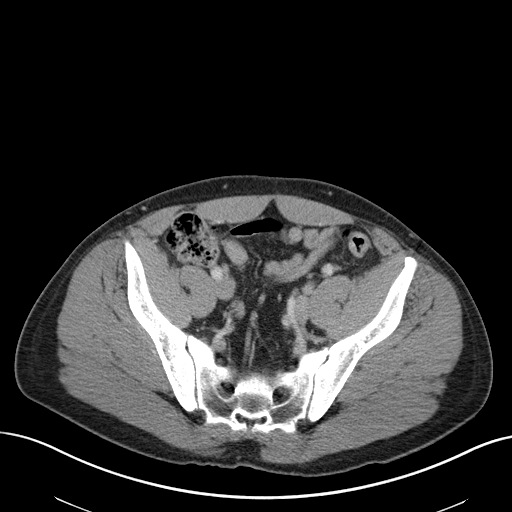
[im 53/66  soft-tissue]
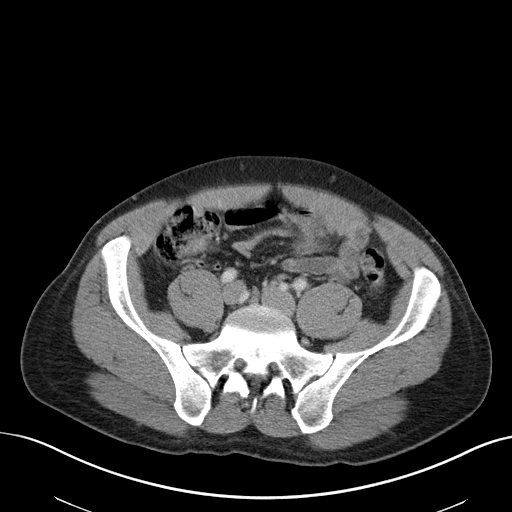
[im 57/66  soft-tissue]
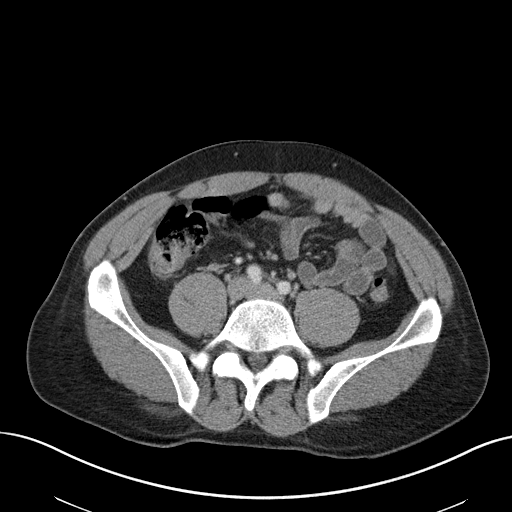
[im 61/66  soft-tissue]
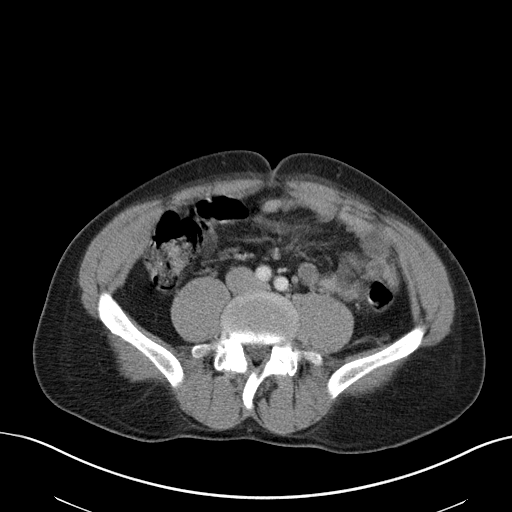

[Series 8: coronal st · coronal · 0.66mm/px · 3 of 127 slices shown]
[im 43/127  soft-tissue]
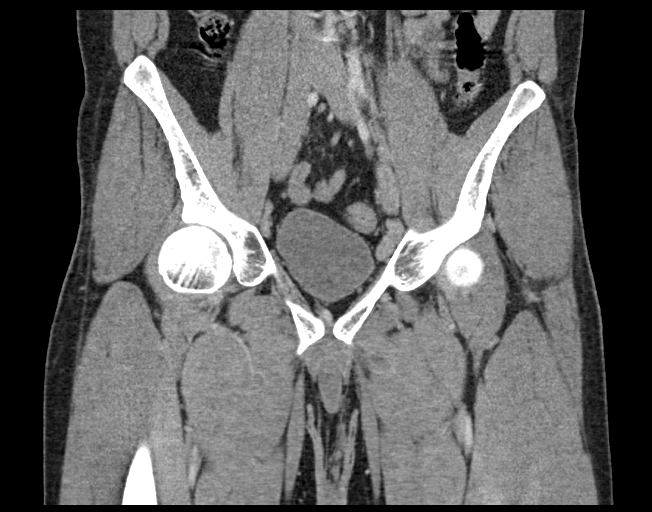
[im 57/127  soft-tissue]
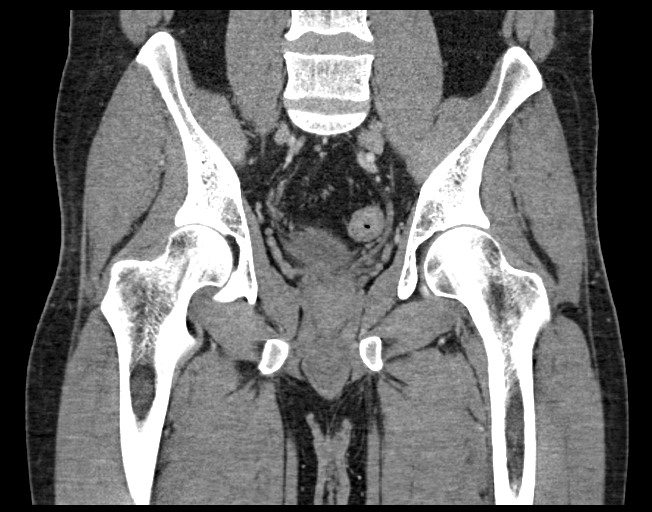
[im 71/127  soft-tissue]
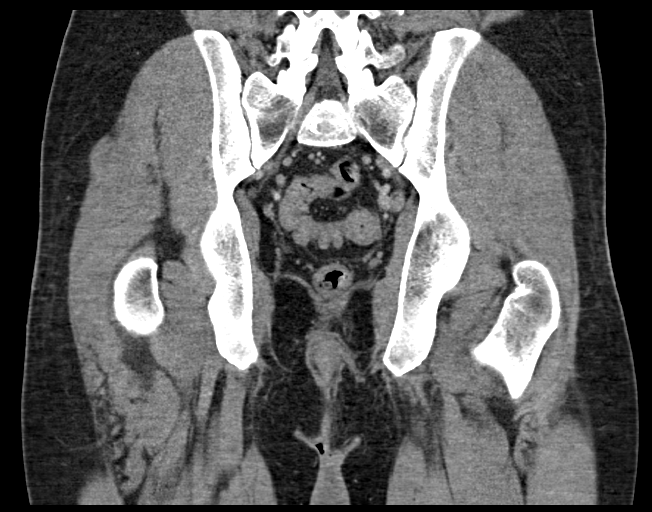

[16 of 46 positions shown; findings below may reference images not displayed]

FINDINGS: Urinary Tract: No distal ureteral dilation. Urinary bladder with
smooth contours.

Bowel: Perianal stranding extending into the LEFT gluteal cleft. Low
attenuation in the LEFT perianal region. Potential ill-defined,
developing fluid collection in this location centrally but without
well-defined collection at this time. In total stranding in the
axial plane measuring approximately 3.8 x 1.5 cm. More pronounced
low attenuation suggested centrally on the coronal images
approximately 2.8 x 0.6 cm.

No gas in the soft tissues.

Visualized gastrointestinal tract is otherwise unremarkable

No discrete tract extending from the sphincter complex or anus to
account for these findings. Mild stranding in the medial upper thigh
in the subcutaneous fight bilaterally.

Vascular/Lymphatic: Mild enlargement of bilateral pelvic lymph nodes
LEFT greater than RIGHT. Most retained fatty hila. Patent pelvic
vasculature with normal caliber.

Reproductive:  Unremarkable.

Other:  No pelvic fluid collection.

On coronal images suggestion of stranding in the gluteal cleft that
extends towards the base of the scrotum.

Musculoskeletal: No acute bone finding.
IMPRESSION: Perianal inflammation extending into the gluteal cleft and towards
the base of the scrotum without defined fluid collection or soft
tissue gas. Correlate with any signs of perianal fistula or other
risk factors for perianal infection such as inflammatory bowel
disease. Area best described at this time as a phlegmon in the
absence of discrete collection or soft tissue gas. Central portion
is denser raising the question of developing fluid collection in
this area.

Stranding extends into the upper thigh particularly on the LEFT.

Mild enlargement of bilateral pelvic lymph nodes LEFT greater than
RIGHT, likely reactive. Attention on follow-up

## 2022-02-07 ENCOUNTER — Encounter: Payer: 59 | Admitting: Infectious Diseases

## 2022-02-10 ENCOUNTER — Telehealth: Payer: Self-pay

## 2022-02-10 ENCOUNTER — Other Ambulatory Visit (HOSPITAL_COMMUNITY): Payer: Self-pay

## 2022-02-10 NOTE — Telephone Encounter (Signed)
[facility] Patient Advocate Encounter ? ?Insurance verification completed.   ? ?The patient is uninsured and will need patient assistance for medication. ? ?We can complete the application and will need to meet with the patient for signatures and income documentation. ? ?Demoni Gergen, CPhT ?Specialty Pharmacy Patient Advocate ?[facility] ?Phone: [phone] ?Fax:  [phone]  ?

## 2022-02-13 ENCOUNTER — Telehealth: Payer: Self-pay

## 2022-02-13 ENCOUNTER — Encounter: Payer: 59 | Admitting: Family

## 2022-02-13 NOTE — Telephone Encounter (Signed)
Attempted to call patient regarding appointment for today. Will need to reschedule appointment if not already on the way to visit. Left voicemail on mobil number. Home number could not be completed.  Juanita Laster, RMA

## 2022-07-04 IMAGING — DX DG ANKLE COMPLETE 3+V*R*
3 series · 3 of 3 positions shown · non-contrast
Comparison: None.

CLINICAL DATA: Ankle pain and swelling.

EXAM:
RIGHT ANKLE - COMPLETE 3+ VIEW

[ankle ap]
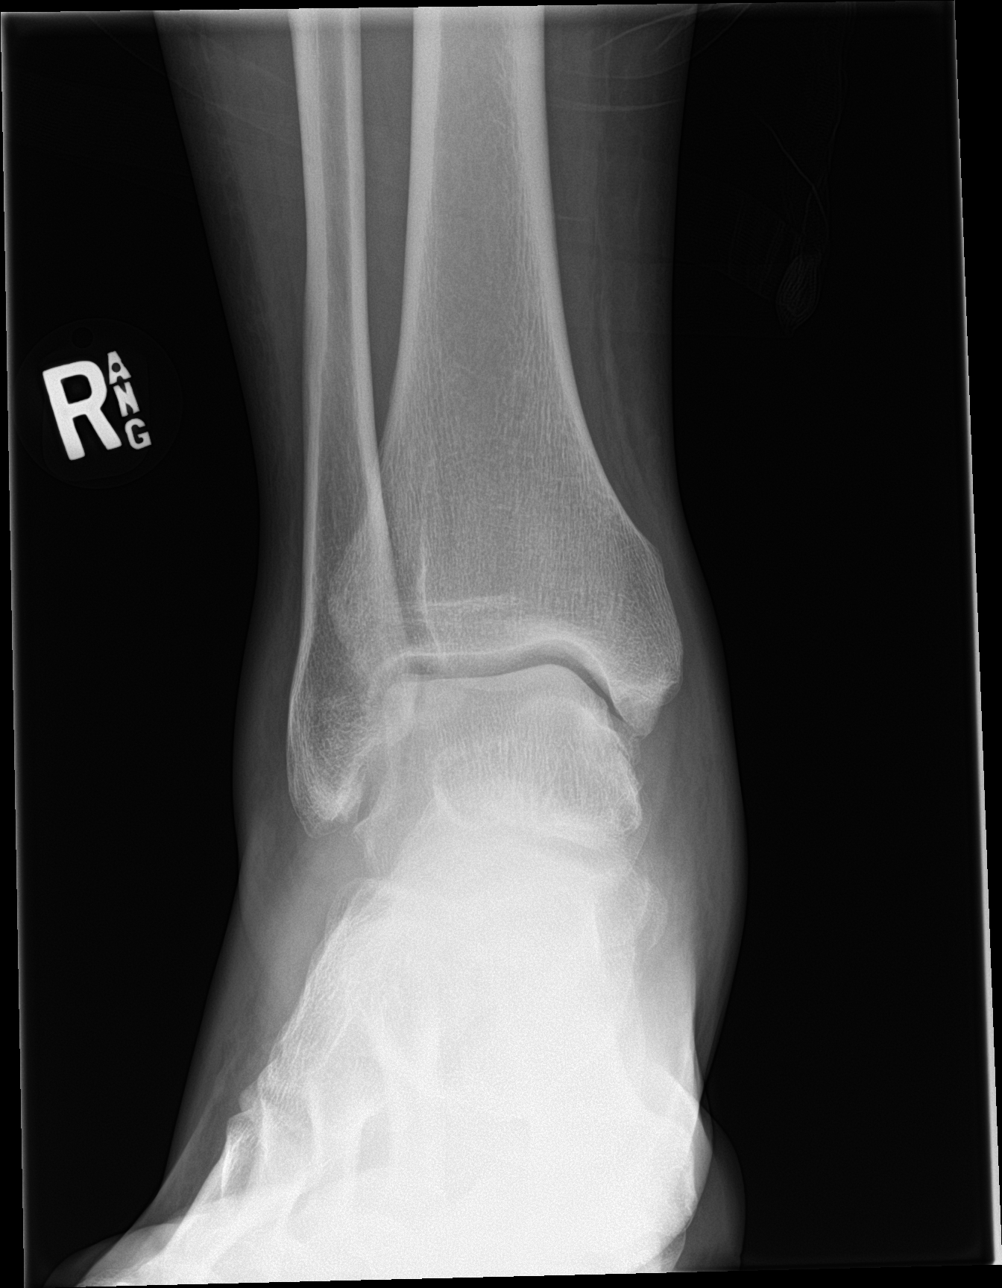

[ankle obl]
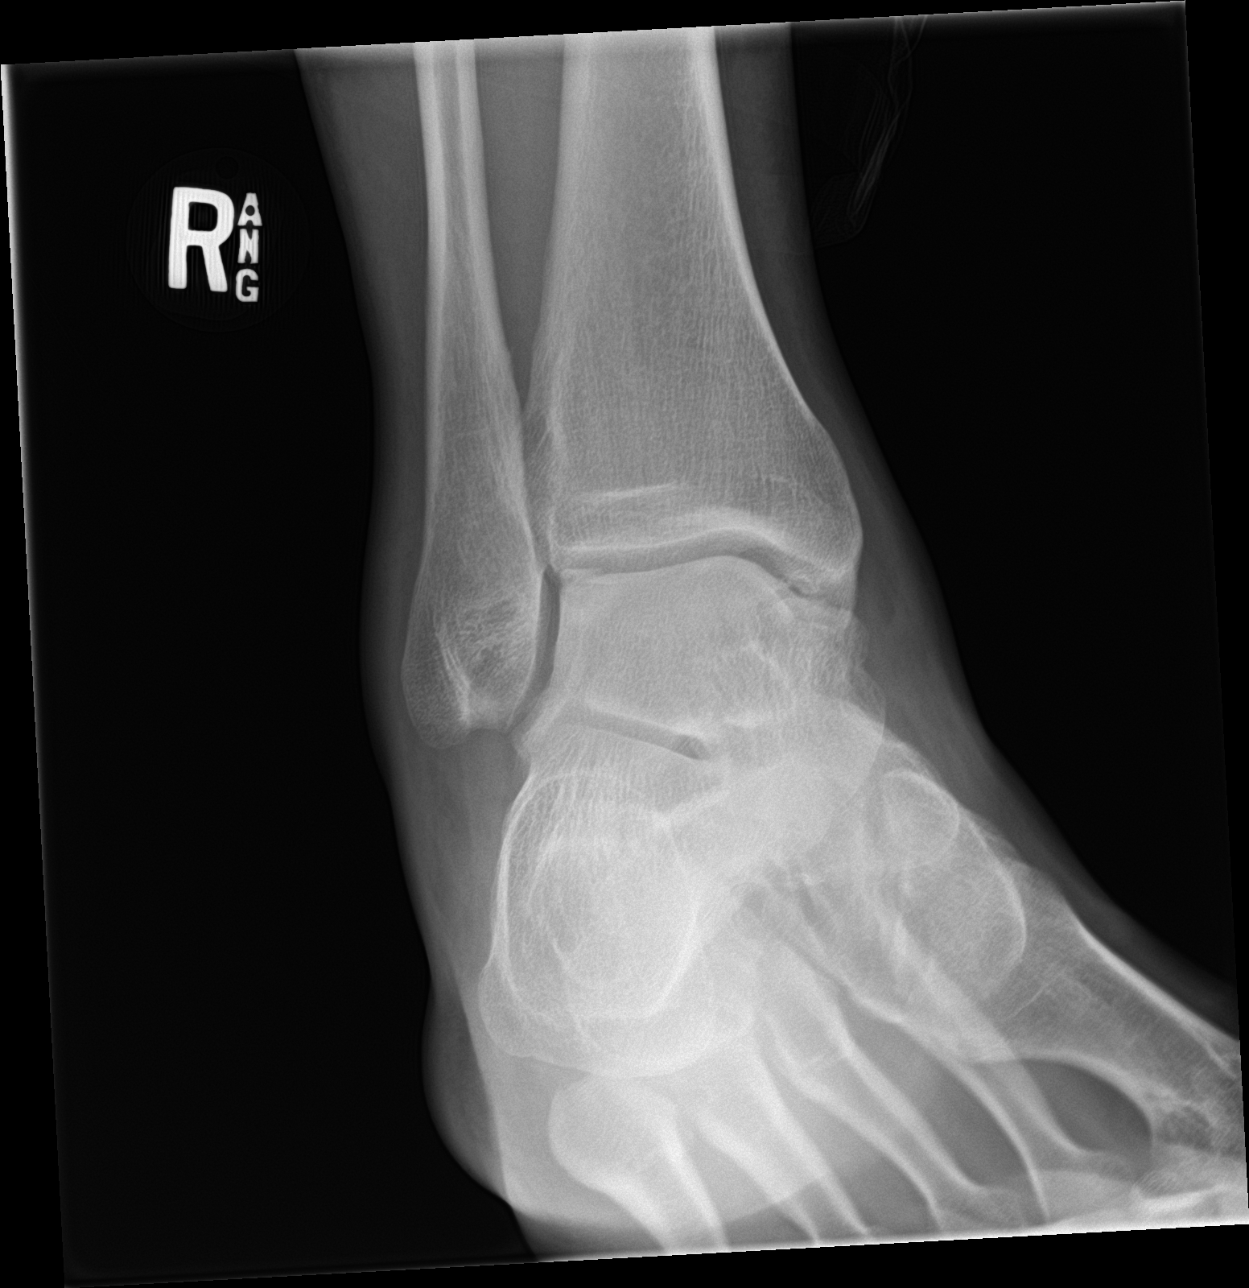

[ankle lat]
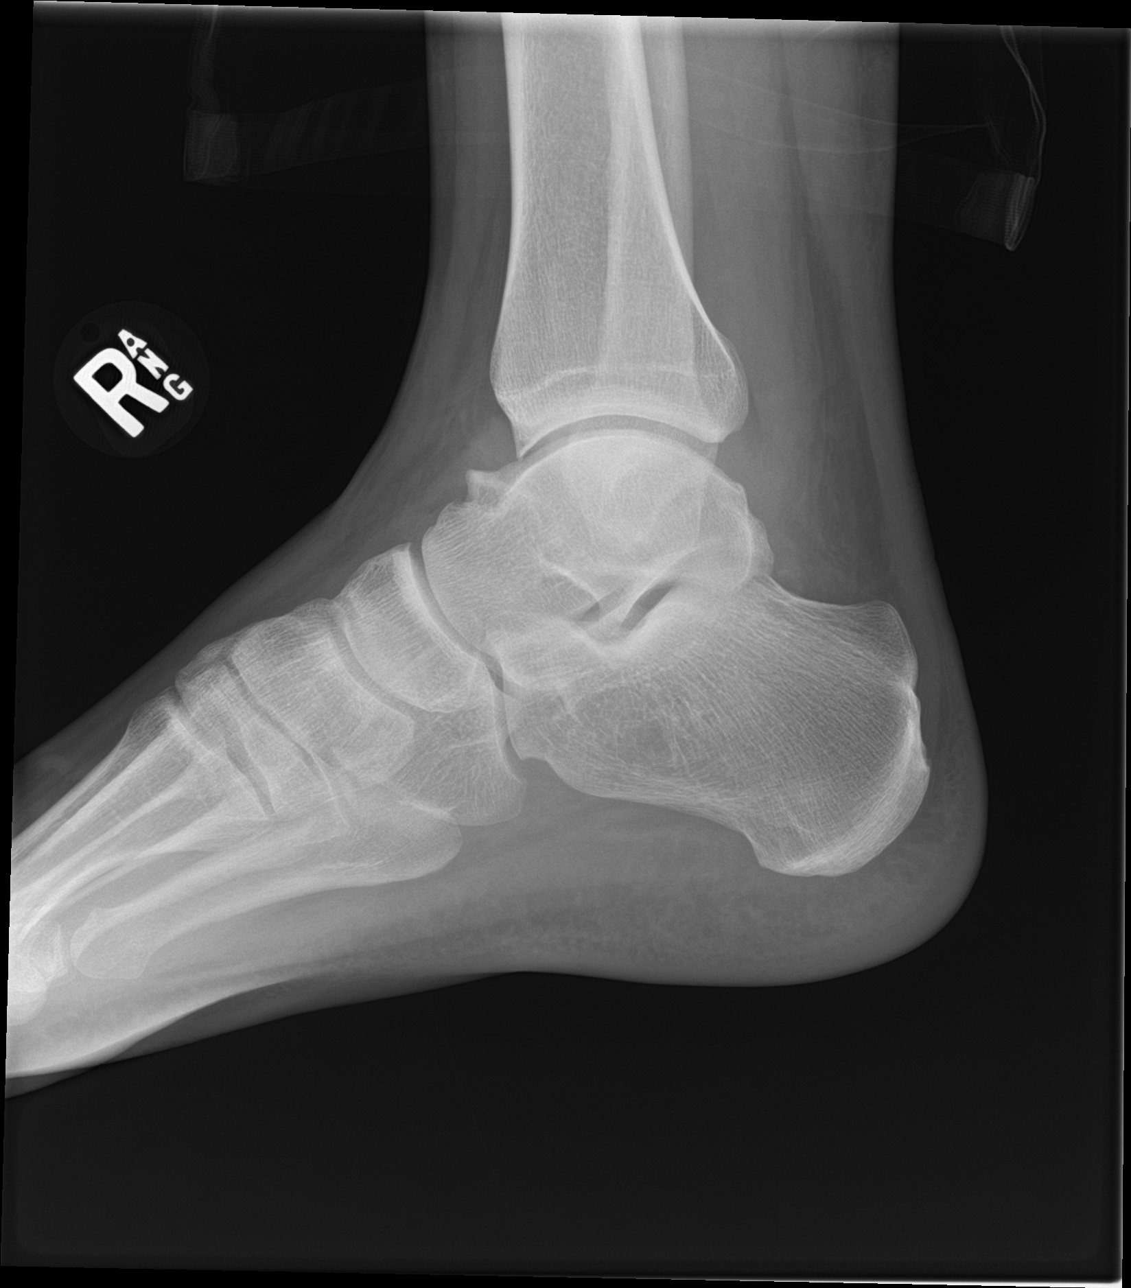

[3 of 3 positions shown; findings below may reference images not displayed]

FINDINGS: There is no visualized fracture or dislocation. Small joint
effusion. There is no evidence of arthropathy or other focal bone
abnormality. Soft tissues are unremarkable.
IMPRESSION: No acute fracture or dislocation. Small joint effusion.

## 2022-07-06 ENCOUNTER — Emergency Department (HOSPITAL_COMMUNITY): Payer: Self-pay

## 2022-07-06 ENCOUNTER — Encounter (HOSPITAL_COMMUNITY): Payer: Self-pay

## 2022-07-06 ENCOUNTER — Emergency Department (HOSPITAL_COMMUNITY)
Admission: EM | Admit: 2022-07-06 | Discharge: 2022-07-06 | Disposition: A | Discharge status: Home / Self Care | Payer: Self-pay | Attending: Emergency Medicine | Admitting: Emergency Medicine

## 2022-07-06 DIAGNOSIS — F17210 Nicotine dependence, cigarettes, uncomplicated: Secondary | ICD-10-CM | POA: Insufficient documentation

## 2022-07-06 DIAGNOSIS — J039 Acute tonsillitis, unspecified: Secondary | ICD-10-CM

## 2022-07-06 DIAGNOSIS — J36 Peritonsillar abscess: Secondary | ICD-10-CM | POA: Insufficient documentation

## 2022-07-06 LAB — CBC WITH DIFFERENTIAL/PLATELET
Abs Immature Granulocytes: 0.03 10*3/uL (ref 0.00–0.07)
Basophils Absolute: 0 10*3/uL (ref 0.0–0.1)
Basophils Relative: 0 %
Eosinophils Absolute: 0.1 10*3/uL (ref 0.0–0.5)
Eosinophils Relative: 1 %
HCT: 45.6 % (ref 39.0–52.0)
Hemoglobin: 15.4 g/dL (ref 13.0–17.0)
Immature Granulocytes: 0 %
Lymphocytes Relative: 25 %
Lymphs Abs: 2.7 10*3/uL (ref 0.7–4.0)
MCH: 30.4 pg (ref 26.0–34.0)
MCHC: 33.8 g/dL (ref 30.0–36.0)
MCV: 89.9 fL (ref 80.0–100.0)
Monocytes Absolute: 1 10*3/uL (ref 0.1–1.0)
Monocytes Relative: 10 %
Neutro Abs: 6.7 10*3/uL (ref 1.7–7.7)
Neutrophils Relative %: 64 %
Platelets: 193 10*3/uL (ref 150–400)
RBC: 5.07 MIL/uL (ref 4.22–5.81)
RDW: 12.8 % (ref 11.5–15.5)
WBC: 10.5 10*3/uL (ref 4.0–10.5)
nRBC: 0 % (ref 0.0–0.2)

## 2022-07-06 LAB — BASIC METABOLIC PANEL
Anion gap: 8 (ref 5–15)
BUN: 13 mg/dL (ref 6–20)
CO2: 26 mmol/L (ref 22–32)
Calcium: 9.1 mg/dL (ref 8.9–10.3)
Chloride: 101 mmol/L (ref 98–111)
Creatinine, Ser: 0.98 mg/dL (ref 0.61–1.24)
GFR, Estimated: >60 mL/min (ref >60)
Glucose, Bld: 113 mg/dL — ABNORMAL HIGH (ref 70–99)
Potassium: 3.6 mmol/L (ref 3.5–5.1)
Sodium: 135 mmol/L (ref 135–145)

## 2022-07-06 MED ORDER — DEXAMETHASONE SODIUM PHOSPHATE 10 MG/ML IJ SOLN: 20.0000 mg | Freq: Once | INTRAMUSCULAR | Status: DC

## 2022-07-06 MED ORDER — DEXAMETHASONE SODIUM PHOSPHATE 10 MG/ML IJ SOLN: 10.0000 mg | Freq: Once | INTRAMUSCULAR | Status: DC

## 2022-07-06 MED ORDER — DEXAMETHASONE SODIUM PHOSPHATE 10 MG/ML IJ SOLN
20.0000 mg | Freq: Once | INTRAMUSCULAR | Status: AC
  Administered 2022-07-06: 20 mg
  Filled 2022-07-06: qty 2

## 2022-07-06 MED ORDER — METHYLPREDNISOLONE 4 MG PO TBPK
ORAL_TABLET | ORAL | 0 refills | Status: DC
  Filled 2022-07-06: qty 21, fill #0

## 2022-07-06 MED ORDER — ACETAMINOPHEN 325 MG PO TABS
650.0000 mg | ORAL_TABLET | Freq: Four times a day (QID) | ORAL | 0 refills | Status: DC | PRN
  Filled 2022-07-06: qty 36, 5d supply, fill #0

## 2022-07-06 MED ORDER — METHYLPREDNISOLONE 4 MG PO TBPK
ORAL_TABLET | ORAL | 0 refills | Status: DC
  Filled 2022-07-06 – 2022-07-07 (×3): qty 21, 6d supply, fill #0

## 2022-07-06 MED ORDER — IBUPROFEN 600 MG PO TABS
600.0000 mg | ORAL_TABLET | Freq: Four times a day (QID) | ORAL | 0 refills | Status: DC | PRN
  Filled 2022-07-06: qty 30, 8d supply, fill #0

## 2022-07-06 MED ORDER — IOHEXOL 300 MG/ML  SOLN
75.0000 mL | Freq: Once | INTRAMUSCULAR | Status: AC | PRN
  Administered 2022-07-06: 75 mL via INTRAVENOUS

## 2022-07-06 MED ORDER — SODIUM CHLORIDE 0.9 % IV SOLN
3.0000 g | Freq: Once | INTRAVENOUS | Status: AC
  Administered 2022-07-06: 3 g via INTRAVENOUS
  Filled 2022-07-06: qty 8

## 2022-07-06 MED ORDER — MENTHOL 3 MG MT LOZG
1.0000 | LOZENGE | OROMUCOSAL | 0 refills | Status: DC | PRN
  Filled 2022-07-06: qty 45, fill #0

## 2022-07-06 MED ORDER — AMOXICILLIN-POT CLAVULANATE 400-57 MG/5ML PO SUSR
800.0000 mg | Freq: Two times a day (BID) | ORAL | 0 refills | Status: AC
  Filled 2022-07-06 – 2022-07-07 (×2): qty 200, 10d supply, fill #0

## 2022-07-06 NOTE — Discharge Instructions (Addendum)
   Please call ENT office tomorrow to schedule follow up appointment in the next 3-5 days.  Please start taking oral antibiotic tomorrow.  Take over-the-counter Motrin or Tylenol as needed for any discomfort.  Please return to the ER if your symptoms worse, difficulty breathing, unable to swallow your own secretions or to tolerate the oral antibiotic medication.  Please refrain from tobacco use  It was a pleasure caring for you today in the emergency department.  Please return to the emergency department for any worsening or worrisome symptoms.

## 2022-07-06 NOTE — ED Provider Notes (Signed)
[facility] DEPT Provider Note  CSN: [id number] Arrival date & time: 07/06/22 1808  Chief Complaint(s) Oral Swelling  HPI Joel Murphy is a 37 y.o. male with past medical history as below, significant for perirectal abscess who presents to the ED with complaint of sore throat, dysphagia.  Symptom onset approximately 3 days ago, pain to the right side of his throat, difficulty swallowing, pain with swallowing.  No vomiting no nausea.  Subjective fever at home.  No voice changes, no breathing abnormalities.  Denies history of prior tonsillitis, denies care of ENT in the past. No n/v, no dib.   Past Medical History History reviewed. No pertinent past medical history. Patient Active Problem List   Diagnosis Date Noted   Perirectal abscess 12/27/2020   Home Medication(s) Prior to Admission medications   Medication Sig Start Date End Date Taking? Authorizing Provider  acetaminophen (TYLENOL) 325 MG tablet Take 2 tablets (650 mg total) by mouth every 6 (six) hours as needed. 07/06/22  Yes Wynona Dove A, DO  amoxicillin-clavulanate (AUGMENTIN) 400-57 MG/5ML suspension Take 10 mLs (800 mg total) by mouth 2 (two) times daily for 10 days. 07/07/22 07/17/22 Yes Wynona Dove A, DO  ibuprofen (ADVIL) 600 MG tablet Take 1 tablet (600 mg total) by mouth every 6 (six) hours as needed. 07/06/22  Yes Jeanell Sparrow, DO  menthol-cetylpyridinium (CEPACOL) 3 MG lozenge Take 1 lozenge (3 mg total) by mouth as needed for sore throat. 07/06/22  Yes Jeanell Sparrow, DO  Buprenorphine HCl-Naloxone HCl (SUBOXONE) 8-2 MG FILM Place 1 Film under the tongue 3 (three) times daily. Patient not taking: Reported on 07/06/2022 08/29/21     Buprenorphine HCl-Naloxone HCl 8-2 MG FILM Place 1 Film under the tongue daily. Patient not taking: Reported on 07/06/2022    [provider]  methylPREDNISolone (MEDROL DOSEPAK) 4 MG TBPK tablet Take per manufactures instructions 07/07/22   Wynona Dove A,  DO  naproxen (NAPROSYN) 500 MG tablet Take 1 tablet (500 mg total) by mouth 2 (two) times daily. Patient not taking: Reported on 07/06/2022 07/17/21   Suzy Bouchard, PA-C  ondansetron (ZOFRAN) 4 MG tablet Take 1 tablet (4 mg total) by mouth every 6 (six) hours. Patient not taking: Reported on 07/06/2022 08/19/21   Valarie Merino, MD  oxyCODONE (OXY IR/ROXICODONE) 5 MG immediate release tablet Take 1 tablet (5 mg total) by mouth every 6 (six) hours as needed for severe pain or breakthrough pain. Patient not taking: Reported on 07/06/2022 12/29/20   Earnstine Regal, PA-C                                                                                                                                    Past Surgical History Past Surgical History:  Procedure Laterality Date   INCISION AND DRAINAGE ABSCESS N/A 12/28/2020   Procedure: INCISION AND DRAINAGE PERIRECTAL ABSCESS;  Surgeon: Mickeal Skinner, MD;  Location:  [facility];  Service: General;  Laterality: N/A;   RECTAL EXAM UNDER ANESTHESIA N/A 12/28/2020   Procedure: RECTAL EXAM UNDER ANESTHESIA;  Surgeon: Sheliah Hatch, De Blanch, MD;  Location: [facility];  Service: General;  Laterality: N/A;   Family History History reviewed. No pertinent family history.  Social History Social History   Tobacco Use   Smoking status: Every Day    Packs/day: 0.25    Years: 10.00    Total pack years: 2.50    Types: Cigarettes   Smokeless tobacco: Former    Types: Chew  Substance Use Topics   Alcohol use: No   Drug use: No   Allergies Patient has no known allergies.  Review of Systems Review of Systems  Constitutional:  Negative for chills and fever.  HENT:  Positive for trouble swallowing. Negative for facial swelling.   Eyes:  Negative for photophobia and visual disturbance.  Respiratory:  Negative for cough and shortness of breath.   Cardiovascular:  Negative for chest pain and palpitations.  Gastrointestinal:  Negative for abdominal pain,  nausea and vomiting.  Endocrine: Negative for polydipsia and polyuria.  Genitourinary:  Negative for difficulty urinating and hematuria.  Musculoskeletal:  Negative for gait problem and joint swelling.  Skin:  Negative for pallor and rash.  Neurological:  Negative for syncope and headaches.  Psychiatric/Behavioral:  Negative for agitation and confusion.     Physical Exam Vital Signs  I have reviewed the triage vital signs BP (!) 148/105   Pulse 81   Temp 99.1 F (37.3 C)   Resp 16   SpO2 100%  Physical Exam Vitals and nursing note reviewed.  Constitutional:      General: He is not in acute distress.    Appearance: He is well-developed.  HENT:     Head: Normocephalic and atraumatic. No right periorbital erythema or left periorbital erythema.     Jaw: There is normal jaw occlusion.     Comments: No drooling trismus or stridor No angioedema    Right Ear: External ear normal.     Left Ear: External ear normal.     Mouth/Throat:     Mouth: Mucous membranes are moist.     Pharynx: Uvula midline. Pharyngeal swelling present.     Tonsils: Tonsillar abscess present.     Comments: Likely right-sided tonsillar abscess  Eyes:     General: No scleral icterus. Cardiovascular:     Rate and Rhythm: Normal rate and regular rhythm.     Pulses: Normal pulses.     Heart sounds: Normal heart sounds.  Pulmonary:     Effort: Pulmonary effort is normal. No respiratory distress.     Breath sounds: Normal breath sounds.  Abdominal:     General: Abdomen is flat.     Palpations: Abdomen is soft.     Tenderness: There is no abdominal tenderness.  Musculoskeletal:        General: Normal range of motion.     Cervical back: Normal range of motion.     Right lower leg: No edema.     Left lower leg: No edema.  Skin:    General: Skin is warm and dry.     Capillary Refill: Capillary refill takes less than 2 seconds.  Neurological:     Mental Status: He is alert and oriented to person, place,  and time.  Psychiatric:        Mood and Affect: Mood normal.        Behavior: Behavior normal.  ED Results and Treatments Labs (all labs ordered are listed, but only abnormal results are displayed) Labs Reviewed  BASIC METABOLIC PANEL - Abnormal; Notable for the following components:      Result Value   Glucose, Bld 113 (*)    All other components within normal limits  CBC WITH DIFFERENTIAL/PLATELET                                                                                                                          Radiology CT Soft Tissue Neck W Contrast  Result Date: 07/06/2022 CLINICAL DATA:  Unable to swallow for 3 day, epiglottitis or tonsillitis suspected EXAM: CT NECK WITH CONTRAST TECHNIQUE: Multidetector CT imaging of the neck was performed using the standard protocol following the bolus administration of intravenous contrast. RADIATION DOSE REDUCTION: This exam was performed according to the departmental dose-optimization program which includes automated exposure control, adjustment of the mA and/or kV according to patient size and/or use of iterative reconstruction technique. CONTRAST:  54mL OMNIPAQUE IOHEXOL 300 MG/ML  SOLN COMPARISON:  08/26/2016 FINDINGS: Pharynx and larynx: Enlargement of the right greater than left palatine tonsil, with a low-density collection at the lateral aspect of the right tonsil that measures 1.3 x 2.0 x 2.0 cm (AP x TR x CC) (series 3, image 68 and series 7, image 53). No low-density collection associated with the left tonsil. Right-greater-than-left pharyngeal edema. The larynx is unremarkable. Salivary glands: No inflammation, mass, or stone. Thyroid: Normal. Lymph nodes: Prominent right greater than left upper cervical lymph nodes, without any abnormal density lymph nodes. Vascular: Patent. Limited intracranial: Negative. Visualized orbits: Negative. Mastoids and visualized paranasal sinuses: Minimal mucosal thickening in the ethmoid air cells  and maxillary sinuses. The mastoids are well aerated. Skeleton: No acute osseous abnormality. Upper chest: Negative. Other: None IMPRESSION: 1. Right-greater-than-left palatine tonsillitis, with a low-density collection at the lateral aspect of the right tonsil, concerning for a peritonsillar abscess, measuring up to 2.0 cm. 2. Prominent right greater than left upper cervical lymph nodes, which are favored to be reactive. Electronically Signed   By: Wiliam Ke M.D.   On: 07/06/2022 21:50    Pertinent labs & imaging results that were available during my care of the patient were reviewed by me and considered in my medical decision making (see MDM for details).  Medications Ordered in ED Medications  iohexol (OMNIPAQUE) 300 MG/ML solution 75 mL (75 mLs Intravenous Contrast Given 07/06/22 2040)  dexamethasone (DECADRON) injection 20 mg (20 mg Other Given 07/06/22 2302)  Ampicillin-Sulbactam (UNASYN) 3 g in sodium chloride 0.9 % 100 mL IVPB (0 g Intravenous Stopped 07/06/22 2345)  Procedures Procedures  (including critical care time)  Medical Decision Making / ED Course   MDM:  Joel Murphy is a 37 y.o. male with past medical history as below, significant for perirectal abscess who presents to the ED with complaint of sore throat, dysphagia. . The complaint involves an extensive differential diagnosis and also carries with it a high risk of complications and morbidity.  Serious etiology was considered. Ddx includes but is not limited to: Tonsillitis, odontogenic infection, peritonsillar abscess, epiglottitis, pharyngitis, strep pharyngitis, etc.  On initial assessment the patient is: No drooling stridor or trismus, breathing comfortably on ambient air. Vital signs and nursing notes were reviewed    Labs reviewed and are stable from triage.  CT soft tissue neck with  right-sided peritonsillar abscess.,  Tonsillitis.  Patient is tolerating p.o., give p.o. Decadron, Unasyn.    Patient feeling better after intervention.  At this time recommend continued oral antibiotics, outpatient follow-up with ENT in the next 3-5 days for further management. Given ENT on-call, [facility] for f/u  The patient improved significantly and was discharged in stable condition. Detailed discussions were had with the patient regarding current findings, and need for close f/u with PCP or on call doctor. The patient has been instructed to return immediately if the symptoms worsen in any way for re-evaluation. Patient verbalized understanding and is in agreement with current care plan. All questions answered prior to discharge.      Additional history obtained: -Additional history obtained from na -External records from outside source obtained and reviewed including: Chart review including previous notes, labs, imaging, consultation notes including prior ED visits, prior labs and imaging, home medications.   Lab Tests: -I ordered, reviewed, and interpreted labs.   The pertinent results include:   Labs Reviewed  BASIC METABOLIC PANEL - Abnormal; Notable for the following components:      Result Value   Glucose, Bld 113 (*)    All other components within normal limits  CBC WITH DIFFERENTIAL/PLATELET    Notable for as above  EKG   EKG Interpretation  Date/Time:    Ventricular Rate:    PR Interval:    QRS Duration:   QT Interval:    QTC Calculation:   R Axis:     Text Interpretation:           Imaging Studies ordered: I ordered imaging studies including CT soft tissue neck I independently visualized the following imaging with scope of interpretation limited to determining acute life threatening conditions related to emergency care: As above, PTA I independently visualized and interpreted imaging. I agree with the radiologist interpretation   Medicines  ordered and prescription drug management: Meds ordered this encounter  Medications   DISCONTD: dexamethasone (DECADRON) injection 10 mg   DISCONTD: dexamethasone (DECADRON) injection 20 mg   iohexol (OMNIPAQUE) 300 MG/ML solution 75 mL   dexamethasone (DECADRON) injection 20 mg    po   Ampicillin-Sulbactam (UNASYN) 3 g in sodium chloride 0.9 % 100 mL IVPB    Order Specific Question:   Antibiotic Indication:    Answer:   Other Indication (list below)    Order Specific Question:   Other Indication:    Answer:   pta   amoxicillin-clavulanate (AUGMENTIN) 400-57 MG/5ML suspension    Sig: Take 10 mLs (800 mg total) by mouth 2 (two) times daily for 10 days.    Dispense:  200 mL    Refill:  0   ibuprofen (ADVIL) 600 MG tablet  Sig: Take 1 tablet (600 mg total) by mouth every 6 (six) hours as needed.    Dispense:  30 tablet    Refill:  0   acetaminophen (TYLENOL) 325 MG tablet    Sig: Take 2 tablets (650 mg total) by mouth every 6 (six) hours as needed.    Dispense:  36 tablet    Refill:  0   menthol-cetylpyridinium (CEPACOL) 3 MG lozenge    Sig: Take 1 lozenge (3 mg total) by mouth as needed for sore throat.    Dispense:  45 tablet    Refill:  0   DISCONTD: methylPREDNISolone (MEDROL DOSEPAK) 4 MG TBPK tablet    Sig: Take per manufactures instructions    Dispense:  21 each    Refill:  0   methylPREDNISolone (MEDROL DOSEPAK) 4 MG TBPK tablet    Sig: Take per manufactures instructions    Dispense:  21 each    Refill:  0    -I have reviewed the patients home medicines and have made adjustments as needed   Consultations Obtained: Not applicable  Cardiac Monitoring: The patient was maintained on a cardiac monitor.  I personally viewed and interpreted the cardiac monitored which showed an underlying rhythm of: NSR  Social Determinants of Health:  Diagnosis or treatment significantly limited by social determinants of health: current smoker Counseled patient for approximately 3  minutes regarding smoking cessation. Discussed risks of smoking and how they applied and affected their visit here today. Patient not ready to quit at this time, however will follow up with their primary doctor when they are.   CPT code: 38182: intermediate counseling for smoking cessation ,m    Reevaluation: After the interventions noted above, I reevaluated the patient and found that they have improved  Co morbidities that complicate the patient evaluation History reviewed. No pertinent past medical history.    Dispostion: Disposition decision including need for hospitalization was considered, and patient discharged from emergency department.    Final Clinical Impression(s) / ED Diagnoses Final diagnoses:  Peritonsillar abscess  Tonsillitis     This chart was dictated using voice recognition software.  Despite best efforts to proofread,  errors can occur which can change the documentation meaning.    Sloan Leiter, DO 07/07/22 [phone]

## 2022-07-06 NOTE — ED Provider Triage Note (Cosign Needed Addendum)
Emergency Medicine Provider Triage Evaluation Note  Joel Murphy , a 37 y.o. male  was evaluated in triage.  Pt complains of patient planing of sore throat for about a week, now is worse on the right side with some swelling noted and pain with swallowing.  Review of Systems  Positive: Sore throat Negative: Fever, vomiting  Physical Exam  BP (!) 157/100 (BP Location: Left Wrist)   Pulse 81   Temp 99.3 F (37.4 C) (Oral)   Resp 16   SpO2 99%  Gen:   Awake, no distress   Resp:  Normal effort  MSK:   Moves extremities without difficulty  Other:  Fullness to right peritonsillar area with mild deviation of the uvula.  No pointing.  Patient handles oral secretions well and speaks in full clear sentences  Medical Decision Making  Medically screening exam initiated at 6:51 PM.  Appropriate orders placed.  Joel Murphy was informed that the remainder of the evaluation will be completed by another provider, this initial triage assessment does not replace that evaluation, and the importance of remaining in the ED until their evaluation is complete.     Joel Abbot, PA-C 07/06/22 1853    Joel Abbot, PA-C 07/06/22 1901

## 2022-07-06 NOTE — ED Triage Notes (Signed)
Pt states that two weeks ago he had URI and in the last few days he's had a sore throat with oral swelling and R ear pain. Pt denies fevers. No trouble breathing per pt.

## 2022-07-07 ENCOUNTER — Other Ambulatory Visit (HOSPITAL_COMMUNITY): Payer: Self-pay

## 2022-07-07 ENCOUNTER — Other Ambulatory Visit: Payer: Self-pay

## 2022-07-27 ENCOUNTER — Encounter (HOSPITAL_COMMUNITY): Payer: Self-pay | Admitting: Emergency Medicine

## 2022-07-27 ENCOUNTER — Other Ambulatory Visit: Payer: Self-pay

## 2022-07-27 ENCOUNTER — Emergency Department (HOSPITAL_COMMUNITY)
Admission: EM | Admit: 2022-07-27 | Discharge: 2022-07-28 | Disposition: A | Discharge status: Home / Self Care | Payer: Self-pay | Attending: Emergency Medicine | Admitting: Emergency Medicine

## 2022-07-27 DIAGNOSIS — F10920 Alcohol use, unspecified with intoxication, uncomplicated: Secondary | ICD-10-CM

## 2022-07-27 DIAGNOSIS — Y909 Presence of alcohol in blood, level not specified: Secondary | ICD-10-CM | POA: Insufficient documentation

## 2022-07-27 DIAGNOSIS — F10129 Alcohol abuse with intoxication, unspecified: Secondary | ICD-10-CM | POA: Insufficient documentation

## 2022-07-27 DIAGNOSIS — S060X1A Concussion with loss of consciousness of 30 minutes or less, initial encounter: Secondary | ICD-10-CM | POA: Insufficient documentation

## 2022-07-27 DIAGNOSIS — S022XXA Fracture of nasal bones, initial encounter for closed fracture: Secondary | ICD-10-CM | POA: Insufficient documentation

## 2022-07-27 HISTORY — DX: Opioid abuse, uncomplicated: F11.10
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
## 2022-07-28 ENCOUNTER — Emergency Department (HOSPITAL_COMMUNITY): Payer: Self-pay

## 2022-07-28 NOTE — ED Notes (Signed)
Transported to CT 
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
## 2024-05-26 ENCOUNTER — Ambulatory Visit (INDEPENDENT_AMBULATORY_CARE_PROVIDER_SITE_OTHER): Payer: Self-pay

## 2024-05-26 ENCOUNTER — Encounter (INDEPENDENT_AMBULATORY_CARE_PROVIDER_SITE_OTHER): Payer: Self-pay

## 2024-05-26 VITALS — BP 134/92 | HR 83 | Temp 97.6°F | Ht 71.0 in | Wt 225.0 lb

## 2024-05-26 DIAGNOSIS — J36 Peritonsillar abscess: Secondary | ICD-10-CM

## 2024-05-26 DIAGNOSIS — J358 Other chronic diseases of tonsils and adenoids: Secondary | ICD-10-CM

## 2024-05-26 DIAGNOSIS — J039 Acute tonsillitis, unspecified: Secondary | ICD-10-CM

## 2024-05-26 MED ORDER — METHYLPREDNISOLONE 4 MG PO TBPK: ORAL_TABLET | ORAL | 0 refills | Status: DC

## 2024-05-26 MED ORDER — CLINDAMYCIN HCL 300 MG PO CAPS: 300.0000 mg | ORAL_CAPSULE | Freq: Three times a day (TID) | ORAL | 0 refills | Status: AC

## 2024-05-26 NOTE — Progress Notes (Signed)
 Dear Dr. Lindsay, Here is my assessment for our mutual patient, Joel Murphy. Thank you for allowing me the opportunity to care for your patient. Please do not hesitate to contact me should you have any other questions. Sincerely, Dr. Hadassah Parody  Otolaryngology Clinic Note Referring provider: Dr. Lindsay HPI:   Initial HPI (05/26/2024) Discussed the use of AI scribe software for clinical note transcription with the patient, who gave verbal consent to proceed.  History of Present Illness Joel Murphy is a 38 year old male who presents with recurrent tonsil swelling and pain.  - Recurrent tonsillar swelling, predominantly affecting the right side - History of recurrent PTA. Has had to have PTA drained twice years ago.  - Current episode with pressure sensation in the ear and discomfort. Took augmentin  and felt better but sx returned. More recently was put on a 10 day course of PCN and this did not help.  - Felt sweaty with augmentin  so didn't prefer this.  - some odynophagia but eating oK   Tobacco use - History of smoking since 2005 per record. Reports he recently quit at beginning of this year.  - History of ETOH use    Independent Review of Additional Tests or Records:  Referral note 05/19/24 Joel Lindsay, PA: swollen tonsil since October. Refer to ENT   CT face 07/28/22 independently reviewed no tonsillar asymmetry at that time.    PMH/Meds/All/SocHx/FamHx/ROS:   Past Medical History:  Diagnosis Date   Opioid abuse (HCC)    states clean x 1 yr     Past Surgical History:  Procedure Laterality Date   INCISION AND DRAINAGE ABSCESS N/A 12/28/2020   Procedure: INCISION AND DRAINAGE PERIRECTAL ABSCESS;  Surgeon: Stevie, Herlene Righter, MD;  Location: [facility];  Service: General;  Laterality: N/A;   RECTAL EXAM UNDER ANESTHESIA N/A 12/28/2020   Procedure: RECTAL EXAM UNDER ANESTHESIA;  Surgeon: Stevie Herlene Righter, MD;  Location: [facility];  Service: General;  Laterality: N/A;     No family history on file.   Social Connections: Not on file     Current Outpatient Medications  Medication Instructions   acetaminophen  (TYLENOL ) 650 mg, Oral, Every 6 hours PRN   clindamycin  (CLEOCIN ) 300 mg, Oral, 3 times daily   ibuprofen  (ADVIL ) 600 mg, Oral, Every 6 hours PRN   menthol  (CEPACOL) 3 mg, Oral, As needed   methylPREDNISolone  (MEDROL  DOSEPAK) 4 MG TBPK tablet Follow instructions on packet     Physical Exam:   BP (!) 134/92 (BP Location: Right Arm, Patient Position: Sitting) Comment: stressed about everything going on  Pulse 83   Temp 97.6 F (36.4 C)   Ht 5' 11 (1.803 m)   Wt 225 lb (102.1 kg)   SpO2 97%   BMI 31.38 kg/m   Salient findings:  CN II-XII intact No lesions of oral cavity; Right tonsil significantly larger than left. No exudate or abscess currently. No ulcerations or mucosal change seen. Slightly rubbery on palpation - no hard mass felt.  Right jugulodigastric small 1cm mobile and soft lymph node. No concerning lymphadenopathy palpated  No respiratory distress or stridor  Seprately Identifiable Procedures:  Prior to initiating any procedures, risks/benefits/alternatives were explained to the patient and verbal consent obtained. None  Impression & Plans:  Joel Murphy is a 38 y.o. male with   1. Recurrent peritonsillar abscess   2. Tonsil asymmetry   3. Tonsillitis     Assessment and Plan Assessment & Plan Chronic right tonsillitis with recurrent infections Chronic right  tonsillitis with recurrent infections, previously drained twice. Discussed that this on its own is an indication for tonsillectomy. Currently, the right tonsil is larger and symptomatic, causing pain and some difficulty swallowing. Previous treatment with Augmentin  was effective but caused adverse effects. Current symptoms have persisted despite recent penicillin  treatment. - Discussed that while this could represent infection (and this is supported by the fact that  his symptoms improved on augmentin  and given history of tonsil infections), this may also represent an underlying neoplasm due to significant  asymmetry and persistent symptoms.  - Prescribed clindamycin  for tonsil infection, with instructions to discontinue if diarrhea occurs. - Prescribed Medrol  dose pack for symptom relief. - Will schedule tonsillectomy and plan to send tonsils separately for path to evaluate for any underlying malignancy  - Will arrange follow-up phone visit to discuss pathology results in 7-10 days. If pathology indicates neoplasm, will plan further workup  Reports no lung or heart problems. Not on a blood thinner. We will get this scheduled in the near future.   We discussed R/B/A for Tonsillectomy including significant post-op pain, bleeding (possibly requiring return to OR), and infections (still with pharyngitis) as well as persistent symptoms and risk of anesthesia. We also discussed post-op management and risks.  Patient would like to proceed with Tonsillectomy.    See below regarding exact medications prescribed this encounter including dosages and route: Meds ordered this encounter  Medications   clindamycin  (CLEOCIN ) 300 MG capsule    Sig: Take 1 capsule (300 mg total) by mouth 3 (three) times daily for 7 days.    Dispense:  21 capsule    Refill:  0   methylPREDNISolone  (MEDROL  DOSEPAK) 4 MG TBPK tablet    Sig: Follow instructions on packet    Dispense:  1 each    Refill:  0      Thank you for allowing me the opportunity to care for your patient. Please do not hesitate to contact me should you have any other questions.  Sincerely, Hadassah Parody, MD Otolaryngologist (ENT), [facility] Phone: [phone] Fax: [phone]  MDM:  Level 4 Complexity/Problems addressed: chronic worsening problem  Data complexity: 2 independent review of one note  - Morbidity: 4 - surgery discussed and drug management  - Prescription Drug  prescribed or managed: yes

## 2024-06-30 ENCOUNTER — Encounter (HOSPITAL_BASED_OUTPATIENT_CLINIC_OR_DEPARTMENT_OTHER): Payer: Self-pay

## 2024-07-07 ENCOUNTER — Ambulatory Visit (HOSPITAL_BASED_OUTPATIENT_CLINIC_OR_DEPARTMENT_OTHER): Payer: Self-pay | Admitting: Anesthesiology

## 2024-07-07 ENCOUNTER — Ambulatory Visit (HOSPITAL_BASED_OUTPATIENT_CLINIC_OR_DEPARTMENT_OTHER)
Admission: RE | Admit: 2024-07-07 | Discharge: 2024-07-07 | Disposition: A | Discharge status: Home / Self Care | Payer: Self-pay

## 2024-07-07 ENCOUNTER — Encounter (HOSPITAL_BASED_OUTPATIENT_CLINIC_OR_DEPARTMENT_OTHER): Payer: Self-pay

## 2024-07-07 ENCOUNTER — Other Ambulatory Visit: Payer: Self-pay

## 2024-07-07 ENCOUNTER — Encounter (HOSPITAL_BASED_OUTPATIENT_CLINIC_OR_DEPARTMENT_OTHER): Admission: RE | Disposition: A | Discharge status: Home / Self Care | Payer: Self-pay | Source: Home / Self Care

## 2024-07-07 DIAGNOSIS — Z87891 Personal history of nicotine dependence: Secondary | ICD-10-CM | POA: Insufficient documentation

## 2024-07-07 DIAGNOSIS — J3501 Chronic tonsillitis: Secondary | ICD-10-CM | POA: Insufficient documentation

## 2024-07-07 DIAGNOSIS — Z01818 Encounter for other preprocedural examination: Secondary | ICD-10-CM

## 2024-07-07 DIAGNOSIS — J36 Peritonsillar abscess: Secondary | ICD-10-CM

## 2024-07-07 HISTORY — PX: TONSILLECTOMY: SHX5217

## 2024-07-07 MED ORDER — MIDAZOLAM HCL 2 MG/2ML IJ SOLN
INTRAMUSCULAR | Status: AC
  Filled 2024-07-07: qty 2

## 2024-07-07 MED ORDER — OXYCODONE HCL 5 MG/5ML PO SOLN: 5.0000 mg | Freq: Once | ORAL | Status: DC | PRN

## 2024-07-07 MED ORDER — SUGAMMADEX SODIUM 200 MG/2ML IV SOLN
INTRAVENOUS | Status: DC | PRN
  Administered 2024-07-07: 220 mg via INTRAVENOUS

## 2024-07-07 MED ORDER — AMISULPRIDE (ANTIEMETIC) 5 MG/2ML IV SOLN: 10.0000 mg | Freq: Once | INTRAVENOUS | Status: DC | PRN

## 2024-07-07 MED ORDER — FENTANYL CITRATE (PF) 100 MCG/2ML IJ SOLN
INTRAMUSCULAR | Status: AC
  Filled 2024-07-07: qty 2

## 2024-07-07 MED ORDER — OXYMETAZOLINE HCL 0.05 % NA SOLN
NASAL | Status: DC | PRN
  Administered 2024-07-07: 1 via TOPICAL

## 2024-07-07 MED ORDER — ACETAMINOPHEN 500 MG PO TABS
ORAL_TABLET | ORAL | Status: AC
  Filled 2024-07-07: qty 2

## 2024-07-07 MED ORDER — PROPOFOL 10 MG/ML IV BOLUS
INTRAVENOUS | Status: DC | PRN
  Administered 2024-07-07: 200 mg via INTRAVENOUS
  Administered 2024-07-07: 50 mg via INTRAVENOUS

## 2024-07-07 MED ORDER — IBUPROFEN 600 MG PO TABS: 600.0000 mg | ORAL_TABLET | Freq: Four times a day (QID) | ORAL | 0 refills | Status: AC

## 2024-07-07 MED ORDER — ROCURONIUM BROMIDE 100 MG/10ML IV SOLN
INTRAVENOUS | Status: DC | PRN
  Administered 2024-07-07: 40 mg via INTRAVENOUS

## 2024-07-07 MED ORDER — ACETAMINOPHEN 500 MG PO TABS: 1000.0000 mg | ORAL_TABLET | Freq: Four times a day (QID) | ORAL | 0 refills | Status: AC

## 2024-07-07 MED ORDER — FENTANYL CITRATE (PF) 100 MCG/2ML IJ SOLN
25.0000 ug | INTRAMUSCULAR | Status: DC | PRN
  Administered 2024-07-07 (×2): 25 ug via INTRAVENOUS
  Administered 2024-07-07: 50 ug via INTRAVENOUS

## 2024-07-07 MED ORDER — 0.9 % SODIUM CHLORIDE (POUR BTL) OPTIME
TOPICAL | Status: DC | PRN
  Administered 2024-07-07: 1000 mL

## 2024-07-07 MED ORDER — DEXMEDETOMIDINE HCL IN NACL 80 MCG/20ML IV SOLN
INTRAVENOUS | Status: DC | PRN
  Administered 2024-07-07: 8 ug via INTRAVENOUS
  Administered 2024-07-07: 4 ug via INTRAVENOUS
  Administered 2024-07-07: 8 ug via INTRAVENOUS

## 2024-07-07 MED ORDER — OXYCODONE HCL 5 MG PO TABS: 5.0000 mg | ORAL_TABLET | Freq: Three times a day (TID) | ORAL | 0 refills | Status: AC | PRN

## 2024-07-07 MED ORDER — ACETAMINOPHEN 500 MG PO TABS
1000.0000 mg | ORAL_TABLET | Freq: Once | ORAL | Status: AC
  Administered 2024-07-07: 1000 mg via ORAL

## 2024-07-07 MED ORDER — FENTANYL CITRATE (PF) 100 MCG/2ML IJ SOLN
INTRAMUSCULAR | Status: DC | PRN
  Administered 2024-07-07 (×2): 50 ug via INTRAVENOUS
  Administered 2024-07-07: 100 ug via INTRAVENOUS

## 2024-07-07 MED ORDER — DEXAMETHASONE SOD PHOSPHATE PF 10 MG/ML IJ SOLN
INTRAMUSCULAR | Status: DC | PRN
  Administered 2024-07-07: 10 mg via INTRAVENOUS

## 2024-07-07 MED ORDER — ONDANSETRON HCL 4 MG/2ML IJ SOLN
INTRAMUSCULAR | Status: DC | PRN
  Administered 2024-07-07: 4 mg via INTRAVENOUS

## 2024-07-07 MED ORDER — LIDOCAINE 2% (20 MG/ML) 5 ML SYRINGE
INTRAMUSCULAR | Status: AC
  Filled 2024-07-07: qty 5

## 2024-07-07 MED ORDER — LACTATED RINGERS IV SOLN: INTRAVENOUS | Status: DC

## 2024-07-07 MED ORDER — OXYCODONE HCL 5 MG PO TABS: 5.0000 mg | ORAL_TABLET | Freq: Once | ORAL | Status: DC | PRN

## 2024-07-07 MED ORDER — MIDAZOLAM HCL 5 MG/5ML IJ SOLN
INTRAMUSCULAR | Status: DC | PRN
  Administered 2024-07-07: 2 mg via INTRAVENOUS

## 2024-07-07 MED ORDER — ONDANSETRON 4 MG PO TBDP: 4.0000 mg | ORAL_TABLET | Freq: Three times a day (TID) | ORAL | 0 refills | Status: AC | PRN

## 2024-07-07 NOTE — H&P (Signed)
 Pre-Operative H&P - Day Of Surgery Patient Name: Joel Murphy Date:   07/07/2024  HPI: Joel Murphy is a 39 y.o. male who presents today for operative treatment of chronic tonsillitis, tonsillar hypertrophy, recurrent peritonsillar abscess. Patient denies recent significant changes to health or significant new medications or physiologic change in condition which would immediately impact plans. No new types of therapy has been initiated that would change the plan or the appropriateness of the plan.   ROS:  A complete review of systems was obtained and is otherwise negative.   PMH:  Past Medical History:  Diagnosis Date   Opioid abuse (HCC)    states clean x 1 yr    PSH:  Past Surgical History:  Procedure Laterality Date   INCISION AND DRAINAGE ABSCESS N/A 12/28/2020   Procedure: INCISION AND DRAINAGE PERIRECTAL ABSCESS;  Surgeon: Stevie, Herlene Righter, MD;  Location: [facility];  Service: General;  Laterality: N/A;   RECTAL EXAM UNDER ANESTHESIA N/A 12/28/2020   Procedure: RECTAL EXAM UNDER ANESTHESIA;  Surgeon: Stevie Herlene Righter, MD;  Location: [facility];  Service: General;  Laterality: N/A;    MEDS:  Current Medications[1]  ALLERGIES: Patient has no known allergies.  EXAM: Vitals: BP (!) 137/97   Pulse 71   Temp 98 F (36.7 C) (Temporal)   Resp (!) 71   Ht 5' 11 (1.803 m)   Wt 104.8 kg   SpO2 100%   BMI 32.22 kg/m   General Awake, at baseline alertness.   HEENT Globe position appears normal.  External ears normal.  Nose patent without rhinorrhea.  No lymphadenopathy.  No thyromegaly Tonsillar hypertrophy, right > left   Cardiovascular No cyanosis.  Pulmonary Breathing easily with no labor.  Neuro Symmetric facial movement.   Psychiatry Appropriate affect and mood.  Skin No skin lesions on face or neck  Extermities Moves all extremities   Other Findings None.   Assessment & Plan: Joel Murphy has diagnoses of chronic tonsillitis, asymmetric tonsillar hypertrophy, history of  recurrent peritonsillar abscess and will go to the OR today for bilateral tonsillectomy. Informed consent was obtained and available in EMR today. All questions have been answered, and risks/benefits/alternatives of procedure as noted in the consent were discussed in a quiet area. Questions were invited and answered. The patient expressed understanding, provided consent and wished to proceed despite risks.  We discussed management for this, including observation, antibiotics, and tonsillectomy.  We discussed R/B/A for Tonsillectomy including significant post-op pain, bleeding (3%, including life threatening bleeding and requiring return to OR), and infections (still with pharyngitis) as well as persistent symptoms and risk of anesthesia. We also discussed post-op management and risks.  Patient would like to proceed with Tonsillectomy.   Joel Murphy 07/07/2024 2:03 PM     [1]  Current Facility-Administered Medications:    lactated ringers  infusion, , Intravenous, Continuous, Woodrum, Chelsey L, MD, Last Rate: 10 mL/hr at 07/07/24 1331, Continued from Pre-op at 07/07/24 1331

## 2024-07-07 NOTE — Op Note (Signed)
 Otolaryngology Operative note  Joel Murphy Date/Time of Admission: 07/07/2024 12:44 PM  CSN: [id number];MRN:[id number]  DOB: December 04, 1985 Age: 39 y.o. Location: [facility]    Pre-Op Diagnosis: Recurrent peritonsillar abscess Tonsillar asymmetry Chronic tonsillitis   Post-Op Diagnosis: Same  Procedure: Bilateral tonsillectomy CPT 42826  Surgeon: Hadassah Parody, MD  Anesthesia type:  General  Anesthesiologist: Anesthesiologist: Patrisha Bernardino SQUIBB, MD CRNA: Debarah Chiquita LABOR, CRNA   Staff: Circulator: Elaine Avelina PARAS, RN Scrub Person: Lelon Daphne BROCKS, RN  Implants: * No implants in log *  Specimens: ID Type Source Tests Collected by Time Destination  1 : Right tonsil Tissue PATH Tonsil/Adenoid SURGICAL PATHOLOGY Parody Hadassah BROCKS, MD 07/07/2024 1501   2 : Left tonsil Tissue PATH Tonsil/Adenoid SURGICAL PATHOLOGY Kymberlie Brazeau, Hadassah BROCKS, MD 07/07/2024 1504      EBL: 10 cc  Drains: none  Post-op disposition and condition: PACU, hemodynamically stable   FINDINGS:  Tonsils:  - Right tonsil: larger than left tonsil, slightly firm to palpation, difficult dissection and almost complete loss of peritonsillar tissue plan inferiorly. Parapharyngeal muscles lateral to the tonsil felt slightly fibrotic. Required meticulous hemostasis inferiorly. These findings could represent underlying neoplasm.  - Left tonsil: chronically inflamed and acutely infected with small peritonsillar abscess evacuated inferiorly the peritonsillar space. Overall easier dissection than the contralateral side and required less time for hemostasis.   Complications: None apparent  Indications and consent:  Joel Murphy is a 39 y.o. male with diagnoses above. Tonsillar asymmetry raises concern for possible underlying neoplasm on the right side. He has had peritonsillar abscesses and infections involving the left as well and so we dicussed removing both tonsils today. The patient's options were  discussed, including risks/benefits/alternatives for each option. Patient expressed understanding, and despite these risks, consented and decided to proceed with above procedures. Informed consent was signed before proceeding.  OPERATIVE DETAILS:  After being properly identified in the preoperative holding area, the patient was brought into the operating suite. Patient was placed supine on the operating table. A pre-procedural time-out was performed and the patient was intubated by the anesthesia team.   The bed was then turned 90 degrees and the patient was prepped and draped in the usual fashion for adenotonsillectomy. Intraoperative steroids were administered. The mouth was held open in suspension using a Crowe-Davis mouth gag and Mayo stand. The left tonsil was grasped using an Allis and removed in a capsular plane using the protected spatula tip Bovie (15 cut, 15 coag). The right tonsil was removed in a similar fashion. Small areas of bleeding and visible blood vessels were coagulated to achieve hemostasis. The operative field was then irrigated and meticulously checked for hemostasis. The Crowe-Davis was released and a flexible suction was used to remove stomach and oropharynx contents. The patient was resuspended and the tonsillar fossae were rechecked for hemostasis. There was no bleeding. The patient tolerated the procedure well.   With the surgical portion of the procedure complete, all instrumentation was then removed from the operative field. The patient's skin was cleaned. Patient was returned to the care of the anesthesia team. The patient was then weaned from his anesthetic and transported to the PACU in stable condition  COMPLICATIONS:  None apparent  CONDITION: Stable, transferred to PACU.  FOLLOW UP:  phone visit set up for later this week to discuss pathology

## 2024-07-07 NOTE — Transfer of Care (Signed)
 Immediate Anesthesia Transfer of Care Note  Patient: Joel Murphy  Procedure(s) Performed: Procedures (LRB): TONSILLECTOMY (Bilateral)  Patient Location: PACU  Anesthesia Type: General  Level of Consciousness: awake, oriented, sedated and patient cooperative  Airway & Oxygen Therapy: Patient Spontanous Breathing and Patient connected to face mask oxygen  Post-op Assessment: Report given to PACU RN and Post -op Vital signs reviewed and stable  Post vital signs: Reviewed and stable  Complications: No apparent anesthesia complications Last Vitals:  Vitals Value Taken Time  BP    Temp    Pulse 72 07/07/24 15:40  Resp 17 07/07/24 15:40  SpO2 100 % 07/07/24 15:40  Vitals shown include unfiled device data.  Last Pain:  Vitals:   07/07/24 1306  TempSrc: Temporal  PainSc: 0-No pain      Patients Stated Pain Goal: 4 (07/07/24 1306)  Complications: No notable events documented.

## 2024-07-07 NOTE — Discharge Instructions (Addendum)
 Tonsillectomy Discharge Instructions  ENT Office Contact Info: Otolaryngology Nursing Triage (Monday-Friday daytime working hours or for emergencies after hours) [phone]  Effects of Anesthesia Tonsillectomy (with or without Adenoidectomy) involves a brief anesthesia, typically 20 - 60 minutes. Patients may be quite irritable for several hours after surgery. If sedatives were given, some patients will remain sleepy for much of the day. Nausea and vomiting is occasionally seen, and usually resolves by the evening of surgery - even without additional medications.  Medications Tonsillectomy is a painful procedure. Pain medications help but do not completely alleviate the discomfort.  For pain, ALTERNATE between Tylenol  and Motrin  and give a dose every 3 hours (i.e. Tylenol  given at 12pm, then Motrin  at 3pm then Tylenol  at 6pm). It is fine to use generic store brands instead of brand name -- [employer] generic has a taste tolerated by most children. You do not need to wait for your child to complain of pain to give them medication, scheduled dosing of medications will control the pain more effectively. Please take 1000  mg of Tylenol  and 600 mg of Ibuprofen  every 6 hours. For children over 41 years of age or adults, we will also prescribe a narcotic pain medication - take 5 mg of oxycodone  every 6-8 hours. Please do not mix with any other narcotic medications or drive while taking narcotics. For adults, do not take more than 4000mg  Tylenol  over a 24 hour period. If you have liver disease, this amount should be less than 2000mg   Activity  Vigorous exercise should be avoided for 14 days after surgery. Baths and showers are fine. Many patients have reduced energy levels until their pain decrease.  You should not travel out of the local area for a full 2 weeks after surgery in case you experience bleeding after surgery.   Eating & Drinking Dehydration is the biggest enemy in the recovery period. It will  increase the pain, increase the risk of bleeding and delay the healing. It usually happens because the pain of swallowing keeps the patient from drinking enough liquids. The only drinks to avoid are citrus like orange and grapefruit juices because they will burn the back of the throat. Incentive charts with prizes work very well to get young children to drink fluids and take their medications after surgery. Some patients will have a small amount of liquid come out of their nose when they drink after surgery, this should stop within a few weeks after surgery. Although drinking is more important, eating is fine even the day of surgery but avoid foods that are crunchy or have sharp edges for 10 days. Dairy products may be taken, if desired. You should avoid acidic, salty and spicy foods (especially tomato sauces). Almost everyone loses some weight after tonsillectomy (which is usually regained in the 2nd or 3rd week after surgery).  Drinking is far more important that eating in the first 14 days after surgery, so concentrate on that first and foremost. Adequate liquid intake probably speeds recovery.  Other things  If the tonsils and adenoids are very large, the patient's voice may change after surgery.  The recovery from tonsillectomy is a very painful period, often the worst pain people can recall, so please be understanding and patient with yourself, or the patient you are caring for. It is helpful to take pain medicine during the night if the patient awakens-- the worst pain is usually in the morning. The pain may seem to increase 2-5 days after surgery -this is normal when  inflammation sets in. Please be aware that no combination of medicines will eliminate the pain - the patient will need to continue eating/drinking in spite of the remaining discomfort.  What should we expect after surgery? As previously mentioned, most patients have a significant amount of pain after tonsillectomy, with pain resolving  7-14 days after surgery. Older children and adults seem to have more discomfort. Most patients can go home the day of surgery.  Ear pain: Many people will complain of earaches after tonsillectomy. This is normal and should resolve. Give pain medications and encourage liquid intake.  Fever: Many patients have a low-grade fever after tonsillectomy - up to 101.5 degrees (380 C.) for several days. Higher prolonged fever should be reported to your surgeon.  Bad looking (and bad smelling) throat: After surgery, the place where the tonsils were removed is covered with a white film, which is a moist scab. This usually develops 3-5 days after surgery and falls off 10-14 days after surgery and usually causes bad breath. There will be some redness and swelling as well. The uvula (the part of the throat that hangs down in the middle between the tonsils) is usually swollen for several days after surgery.  Sore/bruised feeling of Tongue: This is common for the first few days after surgery because the tongue is pushed out of the way to take out the tonsils in surgery.  When should we call the doctor?  Nausea/Vomiting: This is a common side effect from General Anesthesia and can last up to 24-36 hours after surgery. Try giving sips of clear liquids like Sprite, water or apple juice then gradually increase fluid intake. If the nausea or vomiting continues beyond this time frame, call the doctor's office for medications that will help relieve the nausea and vomiting.  Bleeding: Significant bleeding is rare, but it happens to about 5% of patients who have tonsillectomy. It may come from the nose, the mouth, or be vomited or coughed up. Ice water mouthwashes may help stop or reduce bleeding. Please call our office or go to the ED for any bleeding from nose or mouth.  Dehydration: If there has been little or no liquids intake for 24 hours, the patient may need to come to the hospital for IV fluids. Signs of dehydration include  lethargy, the lack of tears when crying, and reduced or very concentrated urine output.  High Fever: If the patient has a consistent temperatures greater than 102, or when accompanied by cough or difficulty breathing, you should call the doctor's office.

## 2024-07-07 NOTE — Anesthesia Preprocedure Evaluation (Addendum)
"                                    Anesthesia Evaluation  Patient identified by MRN, date of birth, ID band Patient awake    Reviewed: Allergy & Precautions, NPO status , Patient's Chart, lab work & pertinent test results  Airway Mallampati: II  TM Distance: >3 FB Neck ROM: Full    Dental  (+) Missing   Pulmonary Patient abstained from smoking., former smoker   Pulmonary exam normal        Cardiovascular negative cardio ROS Normal cardiovascular exam     Neuro/Psych  PSYCHIATRIC DISORDERS      negative neurological ROS     GI/Hepatic negative GI ROS,,,(+)     substance abuse    Endo/Other  negative endocrine ROS    Renal/GU negative Renal ROS     Musculoskeletal negative musculoskeletal ROS (+)    Abdominal  (+) + obese  Peds  Hematology negative hematology ROS (+)   Anesthesia Other Findings Recurrent peritonsillar abscess  Reproductive/Obstetrics                              Anesthesia Physical Anesthesia Plan  ASA: 2  Anesthesia Plan: General   Post-op Pain Management:    Induction: Intravenous  PONV Risk Score and Plan: 2 and Ondansetron , Dexamethasone , Midazolam  and Treatment may vary due to age or medical condition  Airway Management Planned: Oral ETT  Additional Equipment:   Intra-op Plan:   Post-operative Plan: Extubation in OR  Informed Consent: I have reviewed the patients History and Physical, chart, labs and discussed the procedure including the risks, benefits and alternatives for the proposed anesthesia with the patient or authorized representative who has indicated his/her understanding and acceptance.     Dental advisory given  Plan Discussed with: CRNA  Anesthesia Plan Comments:          Anesthesia Quick Evaluation  "

## 2024-07-07 NOTE — Anesthesia Procedure Notes (Signed)
 Procedure Name: Intubation Date/Time: 07/07/2024 2:46 PM  Performed by: Julieanne Fairy BROCKS, CRNAPre-anesthesia Checklist: Patient identified, Emergency Drugs available, Suction available and Patient being monitored Patient Re-evaluated:Patient Re-evaluated prior to induction Oxygen Delivery Method: Circle system utilized Preoxygenation: Pre-oxygenation with 100% oxygen Induction Type: IV induction Ventilation: Mask ventilation without difficulty Laryngoscope Size: Mac and 4 Grade View: Grade I Tube type: Oral Tube size: 7.5 mm Number of attempts: 1 Airway Equipment and Method: Stylet and Oral airway Placement Confirmation: ETT inserted through vocal cords under direct vision, positive ETCO2 and breath sounds checked- equal and bilateral Secured at: 22 cm Tube secured with: Tape Dental Injury: Teeth and Oropharynx as per pre-operative assessment

## 2024-07-08 ENCOUNTER — Encounter (HOSPITAL_BASED_OUTPATIENT_CLINIC_OR_DEPARTMENT_OTHER): Payer: Self-pay

## 2024-07-08 NOTE — Anesthesia Postprocedure Evaluation (Signed)
"   Anesthesia Post Note  Patient: Joel Murphy  Procedure(s) Performed: TONSILLECTOMY (Bilateral: Throat)     Patient location during evaluation: PACU Anesthesia Type: General Level of consciousness: awake Pain management: pain level controlled Vital Signs Assessment: post-procedure vital signs reviewed and stable Respiratory status: spontaneous breathing, nonlabored ventilation and respiratory function stable Cardiovascular status: blood pressure returned to baseline and stable Postop Assessment: no apparent nausea or vomiting Anesthetic complications: no   No notable events documented.  Last Vitals:  Vitals:   07/07/24 1550 07/07/24 1600  BP:  (!) 149/96  Pulse:  69  Resp:  12  Temp:    SpO2: 100% 99%    Last Pain:  Vitals:   07/07/24 1600  TempSrc:   PainSc: 4                  Shereese Bonnie P Gerre Ranum      "

## 2024-07-09 LAB — SURGICAL PATHOLOGY

## 2024-07-11 ENCOUNTER — Ambulatory Visit (INDEPENDENT_AMBULATORY_CARE_PROVIDER_SITE_OTHER): Payer: Self-pay

## 2024-07-11 DIAGNOSIS — J039 Acute tonsillitis, unspecified: Secondary | ICD-10-CM

## 2024-07-11 DIAGNOSIS — J36 Peritonsillar abscess: Secondary | ICD-10-CM

## 2024-07-11 NOTE — Progress Notes (Signed)
" °  Otolaryngology Clinic Phone Note Referring provider: Dr. No ref. provider found HPI:   (07/11/2024) Post-op phone visit   Patient location: [place] Provider location: clinic  Time spent on phone: 10 min  Called pt to discuss results and see how he is doing post-operatively. He is doing very well and pain is starting to subside. Tolerating a diet.   Independent Review of Additional Tests or Records:  none Surg path 07/07/24 showed benign tonsils bilaterally   PMH/Meds/All/SocHx/FamHx/ROS:   Past Medical History:  Diagnosis Date   Opioid abuse (HCC)    states clean x 1 yr     Past Surgical History:  Procedure Laterality Date   INCISION AND DRAINAGE ABSCESS N/A 12/28/2020   Procedure: INCISION AND DRAINAGE PERIRECTAL ABSCESS;  Surgeon: Stevie, Herlene Righter, MD;  Location: [facility];  Service: General;  Laterality: N/A;   RECTAL EXAM UNDER ANESTHESIA N/A 12/28/2020   Procedure: RECTAL EXAM UNDER ANESTHESIA;  Surgeon: Stevie Herlene Righter, MD;  Location: [facility];  Service: General;  Laterality: N/A;   TONSILLECTOMY Bilateral 07/07/2024   Procedure: TONSILLECTOMY;  Surgeon: Greggory Hadassah BROCKS, MD;  Location: [facility];  Service: ENT;  Laterality: Bilateral;    No family history on file.   Social Connections: Not on file     Current Outpatient Medications  Medication Instructions   acetaminophen  (TYLENOL ) 1,000 mg, Oral, Every 6 hours   ibuprofen  (ADVIL ) 600 mg, Oral, Every 6 hours   ondansetron  (ZOFRAN -ODT) 4 mg, Oral, Every 8 hours PRN   oxyCODONE  (ROXICODONE ) 5 mg, Oral, Every 8 hours PRN     Physical Exam:   None - phone note  Seprately Identifiable Procedures:  Prior to initiating any procedures, risks/benefits/alternatives were explained to the patient and verbal consent obtained. None  Impression & Plans:  Joel Murphy is a 39 y.o. male with   1. Recurrent peritonsillar abscess   2. Tonsillitis    39 yo male s/p bilateral tonsillectomy doing well -  ok to resume normal activity once he is 2 weeks out of surgery. Continue to avoid heavy lifting and exercise until then - Reassured that pathology showed benign tonsils  - follow up as needed.   See below regarding exact medications prescribed this encounter including dosages and route: No orders of the defined types were placed in this encounter.    Thank you for allowing me the opportunity to care for your patient. Please do not hesitate to contact me should you have any other questions.  Sincerely, Hadassah Greggory, MD Otolaryngologist (ENT), [facility] Phone: [phone] Fax: [phone]   "

## 2024-07-12 ENCOUNTER — Other Ambulatory Visit: Payer: Self-pay

## 2024-07-12 ENCOUNTER — Observation Stay (HOSPITAL_COMMUNITY)
Admission: EM | Admit: 2024-07-12 | Discharge: 2024-07-12 | Disposition: A | Discharge status: Home / Self Care | Payer: Self-pay | Attending: Otolaryngology | Admitting: Otolaryngology

## 2024-07-12 ENCOUNTER — Encounter (HOSPITAL_COMMUNITY): Payer: Self-pay | Admitting: *Deleted

## 2024-07-12 DIAGNOSIS — J95830 Postprocedural hemorrhage and hematoma of a respiratory system organ or structure following a respiratory system procedure: Principal | ICD-10-CM | POA: Insufficient documentation

## 2024-07-12 DIAGNOSIS — Z87891 Personal history of nicotine dependence: Secondary | ICD-10-CM | POA: Insufficient documentation

## 2024-07-12 DIAGNOSIS — Z79899 Other long term (current) drug therapy: Secondary | ICD-10-CM | POA: Insufficient documentation

## 2024-07-12 DIAGNOSIS — J358 Other chronic diseases of tonsils and adenoids: Secondary | ICD-10-CM | POA: Diagnosis present

## 2024-07-12 LAB — COMPREHENSIVE METABOLIC PANEL WITH GFR
ALT: 99 U/L — ABNORMAL HIGH (ref 0–44)
AST: 49 U/L — ABNORMAL HIGH (ref 15–41)
Albumin: 4.1 g/dL (ref 3.5–5.0)
Alkaline Phosphatase: 79 U/L (ref 38–126)
Anion gap: 11 (ref 5–15)
BUN: 17 mg/dL (ref 6–20)
CO2: 22 mmol/L (ref 22–32)
Calcium: 9.3 mg/dL (ref 8.9–10.3)
Chloride: 102 mmol/L (ref 98–111)
Creatinine, Ser: 0.75 mg/dL (ref 0.61–1.24)
GFR, Estimated: >60 mL/min
Glucose, Bld: 94 mg/dL (ref 70–99)
Potassium: 4.3 mmol/L (ref 3.5–5.1)
Sodium: 135 mmol/L (ref 135–145)
Total Bilirubin: 0.6 mg/dL (ref 0.0–1.2)
Total Protein: 7.5 g/dL (ref 6.5–8.1)

## 2024-07-12 LAB — CBC
HCT: 44.7 % (ref 39.0–52.0)
Hemoglobin: 15.1 g/dL (ref 13.0–17.0)
MCH: 29.3 pg (ref 26.0–34.0)
MCHC: 33.8 g/dL (ref 30.0–36.0)
MCV: 86.8 fL (ref 80.0–100.0)
Platelets: 238 10*3/uL (ref 150–400)
RBC: 5.15 MIL/uL (ref 4.22–5.81)
RDW: 12.9 % (ref 11.5–15.5)
WBC: 11.8 10*3/uL — ABNORMAL HIGH (ref 4.0–10.5)
nRBC: 0 % (ref 0.0–0.2)

## 2024-07-12 LAB — SAMPLE TO BLOOD BANK

## 2024-07-12 MED ORDER — ACETAMINOPHEN 500 MG PO TABS: 1000.0000 mg | ORAL_TABLET | Freq: Four times a day (QID) | ORAL | Status: DC

## 2024-07-12 MED ORDER — DEXTROSE-SODIUM CHLORIDE 5-0.45 % IV SOLN: INTRAVENOUS | Status: DC

## 2024-07-12 MED ORDER — TRANEXAMIC ACID FOR INHALATION
500.0000 mg | Freq: Once | RESPIRATORY_TRACT | Status: DC
  Filled 2024-07-12: qty 10

## 2024-07-12 MED ORDER — IBUPROFEN 400 MG PO TABS: 600.0000 mg | ORAL_TABLET | Freq: Four times a day (QID) | ORAL | Status: DC

## 2024-07-12 MED ORDER — OXYCODONE HCL 5 MG PO TABS: 5.0000 mg | ORAL_TABLET | Freq: Three times a day (TID) | ORAL | Status: DC | PRN

## 2024-07-12 MED ORDER — ONDANSETRON HCL 4 MG/2ML IJ SOLN: 4.0000 mg | Freq: Four times a day (QID) | INTRAMUSCULAR | Status: DC | PRN

## 2024-07-12 MED ORDER — ONDANSETRON 4 MG PO TBDP: 4.0000 mg | ORAL_TABLET | Freq: Four times a day (QID) | ORAL | Status: DC | PRN

## 2024-07-12 MED ORDER — OXYCODONE-ACETAMINOPHEN 5-325 MG PO TABS: 1.0000 | ORAL_TABLET | ORAL | Status: DC | PRN

## 2024-07-12 MED ORDER — ONDANSETRON 4 MG PO TBDP: 4.0000 mg | ORAL_TABLET | Freq: Three times a day (TID) | ORAL | Status: DC | PRN

## 2024-07-12 NOTE — ED Provider Notes (Signed)
 " [facility] Provider Note   CSN: [id number] Arrival date & time: 07/12/24  1643     Patient presents with: No chief complaint on file.   Joel Murphy is a 39 y.o. male.  {Add pertinent medical, surgical, social history, OB history to HPI:1763} 39 year old male history of recurrent peritonsillar abscess who had a bilateral tonsillectomy on 07/07/2024 who presents to the emergency department with post tonsillectomy bleeding.  Patient reports that yesterday he ate a [employer] sandwich but otherwise has been compliant with a soft diet.  Says that today he had 25 minutes where he was having a significant amount of bleeding from his mouth.  Stopped just prior to arrival.  He is not on blood thinners.  Started feeling lightheaded but says the lightheadedness is resolved       Prior to Admission medications  Medication Sig Start Date End Date Taking? Authorizing Provider  acetaminophen  (TYLENOL ) 500 MG tablet Take 2 tablets (1,000 mg total) by mouth every 6 (six) hours for 14 days. 07/07/24 07/21/24  Masciello, Hadassah BROCKS, MD  ibuprofen  (ADVIL ) 600 MG tablet Take 1 tablet (600 mg total) by mouth every 6 (six) hours for 14 days. 07/07/24 07/21/24  Masciello, Hadassah BROCKS, MD  ondansetron  (ZOFRAN -ODT) 4 MG disintegrating tablet Take 1 tablet (4 mg total) by mouth every 8 (eight) hours as needed for nausea or vomiting. 07/07/24   Masciello, Hadassah BROCKS, MD  oxyCODONE  (ROXICODONE ) 5 MG immediate release tablet Take 1 tablet (5 mg total) by mouth every 8 (eight) hours as needed for up to 20 doses for severe pain (pain score 7-10). 07/07/24   Masciello, Hadassah BROCKS, MD    Allergies: Patient has no known allergies.    Review of Systems  Updated Vital Signs BP 136/87 (BP Location: Right Arm)   Pulse 79   Temp 97.9 F (36.6 C)   Resp 19   Ht 5' 11 (1.803 m)   Wt 104.8 kg   SpO2 98%   BMI 32.22 kg/m   Physical Exam Constitutional:      Appearance: Normal appearance.   HENT:     Head: Normocephalic and atraumatic.     Right Ear: External ear normal.     Left Ear: External ear normal.     Nose: Nose normal.     Mouth/Throat:     Mouth: Mucous membranes are moist.     Comments: Tonsillectomy sites with small amount of dried blood.  Left greater than right.  No active bleeding appreciated. Cardiovascular:     Rate and Rhythm: Normal rate and regular rhythm.     Pulses: Normal pulses.     Heart sounds: Normal heart sounds.  Pulmonary:     Effort: Pulmonary effort is normal.     Breath sounds: Normal breath sounds. No stridor.  Musculoskeletal:     Cervical back: Normal range of motion and neck supple.  Neurological:     Mental Status: He is alert.     (all labs ordered are listed, but only abnormal results are displayed) Labs Reviewed  COMPREHENSIVE METABOLIC PANEL WITH GFR  CBC  SAMPLE TO BLOOD BANK    EKG: None  Radiology: No results found.  {Document cardiac monitor, telemetry assessment procedure when appropriate:32947} Procedures   Medications Ordered in the ED  tranexamic acid  (CYKLOKAPRON ) 1000 MG/10ML nebulizer solution 500 mg (has no administration in time range)      {Click here for ABCD2, HEART and other calculators REFRESH Note  before signing:1}                              Medical Decision Making  ***  {Document critical care time when appropriate  Document review of labs and clinical decision tools ie CHADS2VASC2, etc  Document your independent review of radiology images and any outside records  Document your discussion with family members, caretakers and with consultants  Document social determinants of health affecting pt's care  Document your decision making why or why not admission, treatments were needed:32947:::1}   Final diagnoses:  None    ED Discharge Orders     None        "

## 2024-07-12 NOTE — Progress Notes (Signed)
 In room to administer txa neb. Dr roark, ENT at bedside and states to hold neb at this time.

## 2024-07-12 NOTE — Discharge Summary (Signed)
 Physician Discharge Summary  Patient ID: Joel Murphy MRN: [id number] DOB/AGE: [birth year] 39 y.o.  Admit date: 07/12/2024 Discharge date: 07/12/2024  Admission Diagnoses: Post tonsillectomy bleed  Discharge Diagnoses: Same Principal Problem:   Tonsillar bleed   Discharged Condition: good  Hospital Course: Patient never left the emergency room after being admitted for approximately 2 minutes.  He changed his mind and decided to go home rather than be admitted.  He has no bleeding at the time of discharge from the emergency room.  Consults: None  Significant Diagnostic Studies: None  Treatments: None  Discharge Exam: Blood pressure 136/87, pulse 79, temperature 97.9 F (36.6 C), resp. rate 19, height 5' 11 (1.803 m), weight 104.8 kg, SpO2 98%. Patient awake and alert.  See admission H&P for remaining exam items.  Patient not bleeding and a clot on the left superior tonsil.  Patient elected to leave without admission after deciding he would be admitted.  He will follow-up if he has any further bleeding.  Disposition: Discharge disposition: 01-Home or Self Care       Discharge Instructions     Call MD for:   Complete by: As directed    Any bleeding   Call MD for:  difficulty breathing, headache or visual disturbances   Complete by: As directed    Call MD for:  extreme fatigue   Complete by: As directed    Call MD for:  hives   Complete by: As directed    Call MD for:  persistant dizziness or light-headedness   Complete by: As directed    Call MD for:  persistant nausea and vomiting   Complete by: As directed    Call MD for:  redness, tenderness, or signs of infection (pain, swelling, redness, odor or green/yellow discharge around incision site)   Complete by: As directed    Call MD for:  severe uncontrolled pain   Complete by: As directed    Call MD for:  temperature >100.4   Complete by: As directed    Diet full liquid   Complete by: As directed    Discharge  instructions   Complete by: As directed    You decided not to be admitted for observation for the tonsil bleed and now we will need to come immediately back to the emergency room if you have any further bleeding.  We discussed the risk and you are  willing to take that risk.  Continue your liquid diet would be best for the next 24 to 48 hours while the clot is still on your tonsil   Increase activity slowly   Complete by: As directed       Allergies as of 07/12/2024   No Known Allergies      Medication List     TAKE these medications    acetaminophen  500 MG tablet Commonly known as: TYLENOL  Take 2 tablets (1,000 mg total) by mouth every 6 (six) hours for 14 days.   ibuprofen  600 MG tablet Commonly known as: ADVIL  Take 1 tablet (600 mg total) by mouth every 6 (six) hours for 14 days.   ondansetron  4 MG disintegrating tablet Commonly known as: ZOFRAN -ODT Take 1 tablet (4 mg total) by mouth every 8 (eight) hours as needed for nausea or vomiting.   oxyCODONE  5 MG immediate release tablet Commonly known as: Roxicodone  Take 1 tablet (5 mg total) by mouth every 8 (eight) hours as needed for up to 20 doses for severe pain (pain score 7-10).  Signed: Norleen Notice 07/12/2024, 6:05 PM

## 2024-07-12 NOTE — ED Triage Notes (Signed)
 Pt had tonsils removed on Monday and has been spitting out blood for 20 mins then has slowed down. Pt states was lightheaded, but has resolved.

## 2024-07-12 NOTE — H&P (Signed)
 " Reason for Consult: Tonsil bleeding Referring Physician: Dr. Jakie Camellia Joel Murphy is an 39 y.o. male.  HPI: History of tonsillectomy on Monday.  He had some bleeding for about 20 minutes today at 430.  He now has stopped.  He is swallowing well.  His pain is relatively controlled.  He is not in any distress.  Past Medical History:  Diagnosis Date   Opioid abuse (HCC)    states clean x 1 yr    Past Surgical History:  Procedure Laterality Date   INCISION AND DRAINAGE ABSCESS N/A 12/28/2020   Procedure: INCISION AND DRAINAGE PERIRECTAL ABSCESS;  Surgeon: Stevie, Herlene Righter, MD;  Location: [facility];  Service: General;  Laterality: N/A;   RECTAL EXAM UNDER ANESTHESIA N/A 12/28/2020   Procedure: RECTAL EXAM UNDER ANESTHESIA;  Surgeon: Stevie Herlene Righter, MD;  Location: [facility];  Service: General;  Laterality: N/A;   TONSILLECTOMY Bilateral 07/07/2024   Procedure: TONSILLECTOMY;  Surgeon: Greggory Hadassah BROCKS, MD;  Location: [facility];  Service: ENT;  Laterality: Bilateral;    History reviewed. No pertinent family history.  Social History:  reports that he has quit smoking. His smoking use included cigarettes. He has a 2.5 pack-year smoking history. He has quit using smokeless tobacco.  His smokeless tobacco use included chew. He reports that he does not drink alcohol and does not use drugs.  Allergies: Allergies[1]   Results for orders placed or performed during the hospital encounter of 07/12/24 (from the past 48 hours)  Sample to Blood Bank     Status: None   Collection Time: 07/12/24  5:06 PM  Result Value Ref Range   Blood Bank Specimen SAMPLE AVAILABLE FOR TESTING    Sample Expiration      07/15/2024,2359 Performed at [facility], [address] [place], [place] [address]     No results found.  ROS Blood pressure 136/87, pulse 79, temperature 97.9 F (36.6 C), resp. rate 19, height 5' 11 (1.803 m), weight 104.8 kg, SpO2 98%. Physical  Exam Constitutional:      Appearance: Normal appearance.  HENT:     Head: Normocephalic and atraumatic.     Right Ear: Tympanic membrane is without lesions and middle ear aerated, ear canal and external ear normal.     Left Ear: Tympanic membrane is without lesions and middle ear aerated, ear canal and external ear normal.     Nose: Nose without deviation of septum.  Turbinates with mild hypertrophy, No significant swelling or masses.     Oral cavity/oropharynx: There is a clot in the superior pole of the left tonsil.  No active bleeding.  Opens his mouth well.  Mucous membranes are moist. No lesions or masses    Larynx: normal voice. Mirror attempted without success    Eyes:     Extraocular Movements: Extraocular movements intact.     Conjunctiva/sclera: Conjunctivae normal.     Pupils: Pupils are equal, round, and reactive to light.  Cardiovascular:     Rate and Rhythm: Normal rate.  Pulmonary:     Effort: Pulmonary effort is normal.  Musculoskeletal:     Cervical back: Normal range of motion and neck supple. No rigidity.  Lymphadenopathy:     Cervical: No cervical adenopathy or masses.salivary glands without lesions. .     Salivary glands- no mass or swelling Neurological:     Mental Status: He is alert. CN 2-12 intact. No nystagmus      Assessment/Plan: Post tonsillectomy hemorrhage-he  now has stopped after 20 minutes of bleeding.  We talked about options of going home, going to the operating room, or being observed.  We decided on observation especially with the weather coming.  He will be discharged in the morning if he does not have any bleeding.  Norleen Notice 07/12/2024, 5:48 PM        [1] No Known Allergies  "

## 2024-07-16 ENCOUNTER — Ambulatory Visit (INDEPENDENT_AMBULATORY_CARE_PROVIDER_SITE_OTHER): Payer: Self-pay
